# Patient Record
Sex: Male | Born: 1970
Health system: Southern US, Community
[De-identification: ages and names within clinical notes are randomized; demographics above are authoritative.]

## PROBLEM LIST (undated history)

## (undated) DIAGNOSIS — T7840XA Allergy, unspecified, initial encounter: Secondary | ICD-10-CM

## (undated) DIAGNOSIS — I10 Essential (primary) hypertension: Secondary | ICD-10-CM

## (undated) HISTORY — PX: APPENDECTOMY: SHX54

## (undated) HISTORY — PX: ANKLE ARTHROSCOPY: SUR85

## (undated) HISTORY — PX: SHOULDER ARTHROSCOPY: SHX128

## (undated) HISTORY — PX: NASAL SEPTUM SURGERY: SHX37

## (undated) HISTORY — PX: KNEE ARTHROSCOPY: SHX127

## (undated) HISTORY — DX: Essential (primary) hypertension: I10

## (undated) HISTORY — DX: Allergy, unspecified, initial encounter: T78.40XA

---

## 1998-03-18 ENCOUNTER — Emergency Department (HOSPITAL_COMMUNITY): Admission: EM | Admit: 1998-03-18 | Discharge: 1998-03-18 | Payer: Self-pay | Admitting: Emergency Medicine

## 2005-02-16 ENCOUNTER — Emergency Department (HOSPITAL_COMMUNITY): Admission: EM | Admit: 2005-02-16 | Discharge: 2005-02-16 | Payer: Self-pay | Admitting: Emergency Medicine

## 2006-12-04 IMAGING — CR DG WRIST COMPLETE 3+V*R*
4 series · 4 of 4 positions shown · non-contrast
Comparison: none

CLINICAL DATA: Laceration adjacent to the base of the first metacarpal following
an injury.

RIGHT WRIST - 4 VIEW

[view not recorded (1 of 4)]
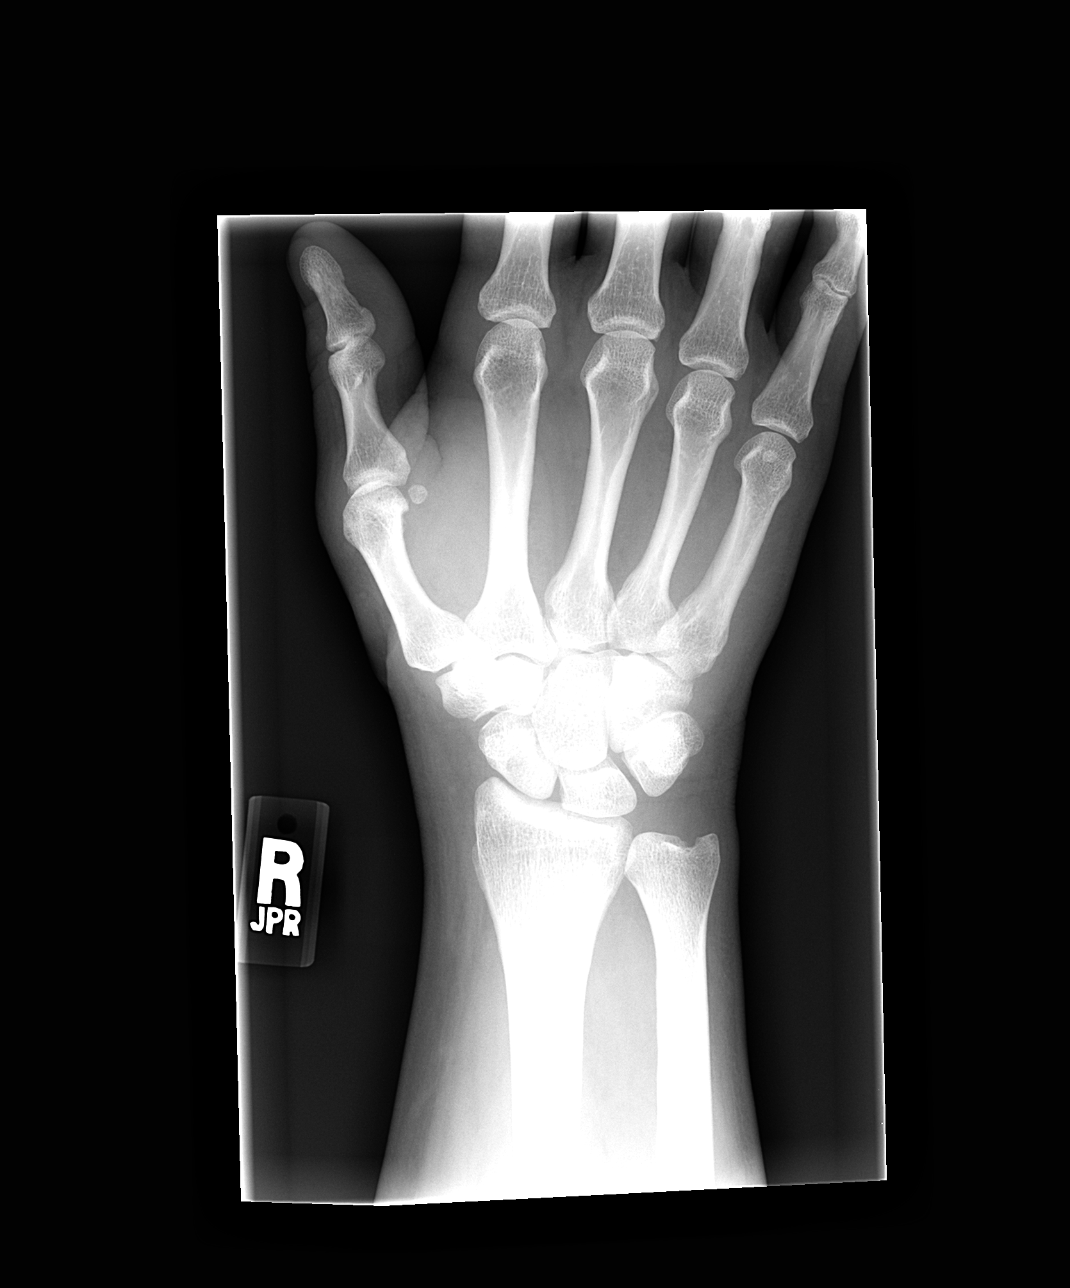

[view not recorded (2 of 4)]
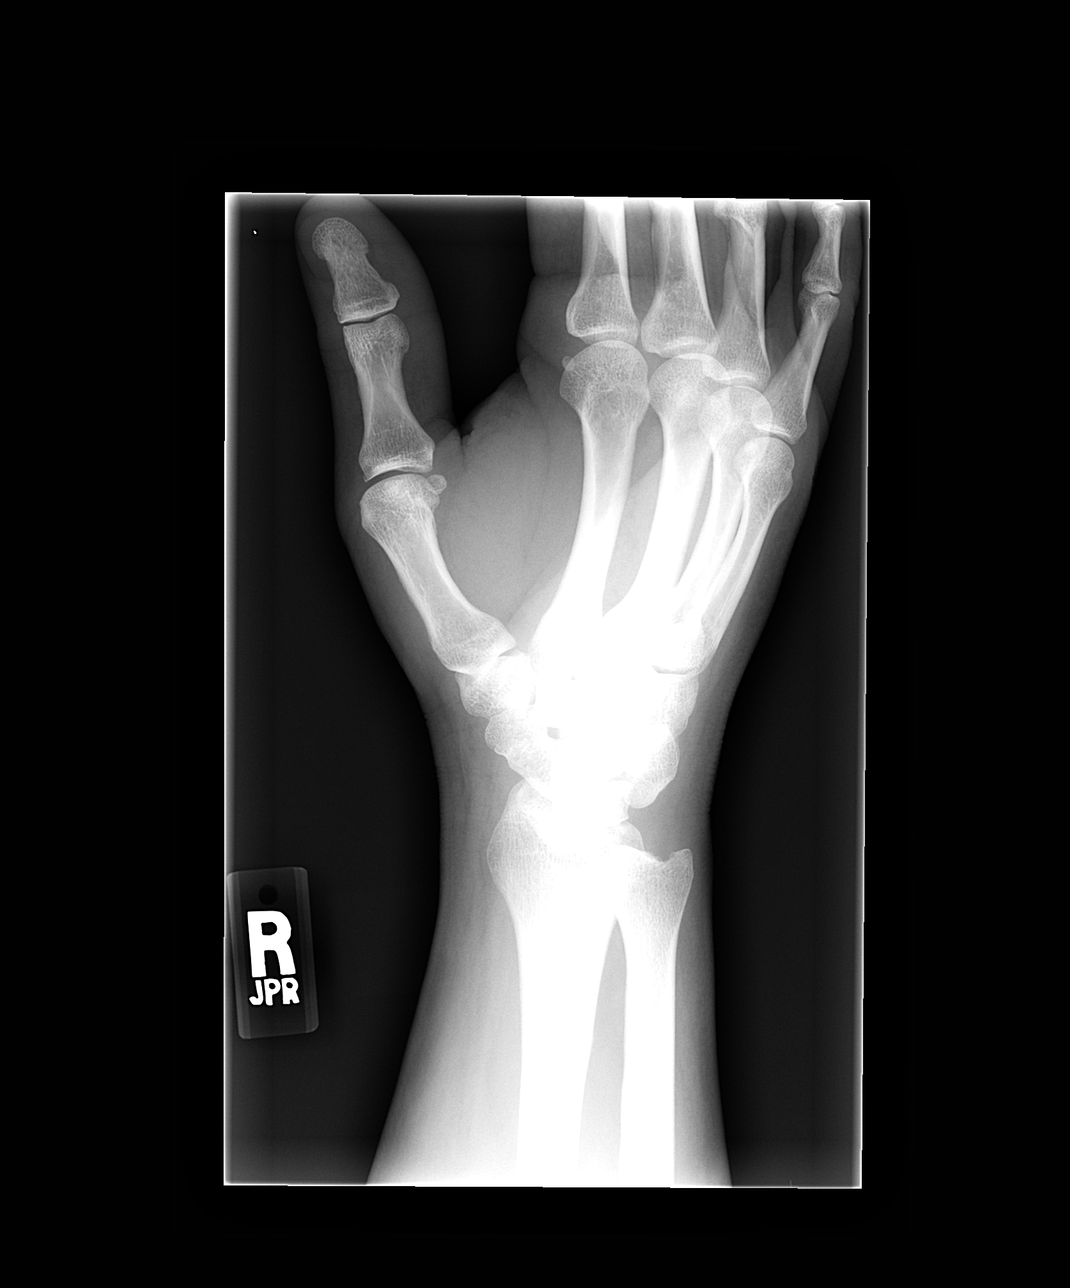

[view not recorded (3 of 4)]
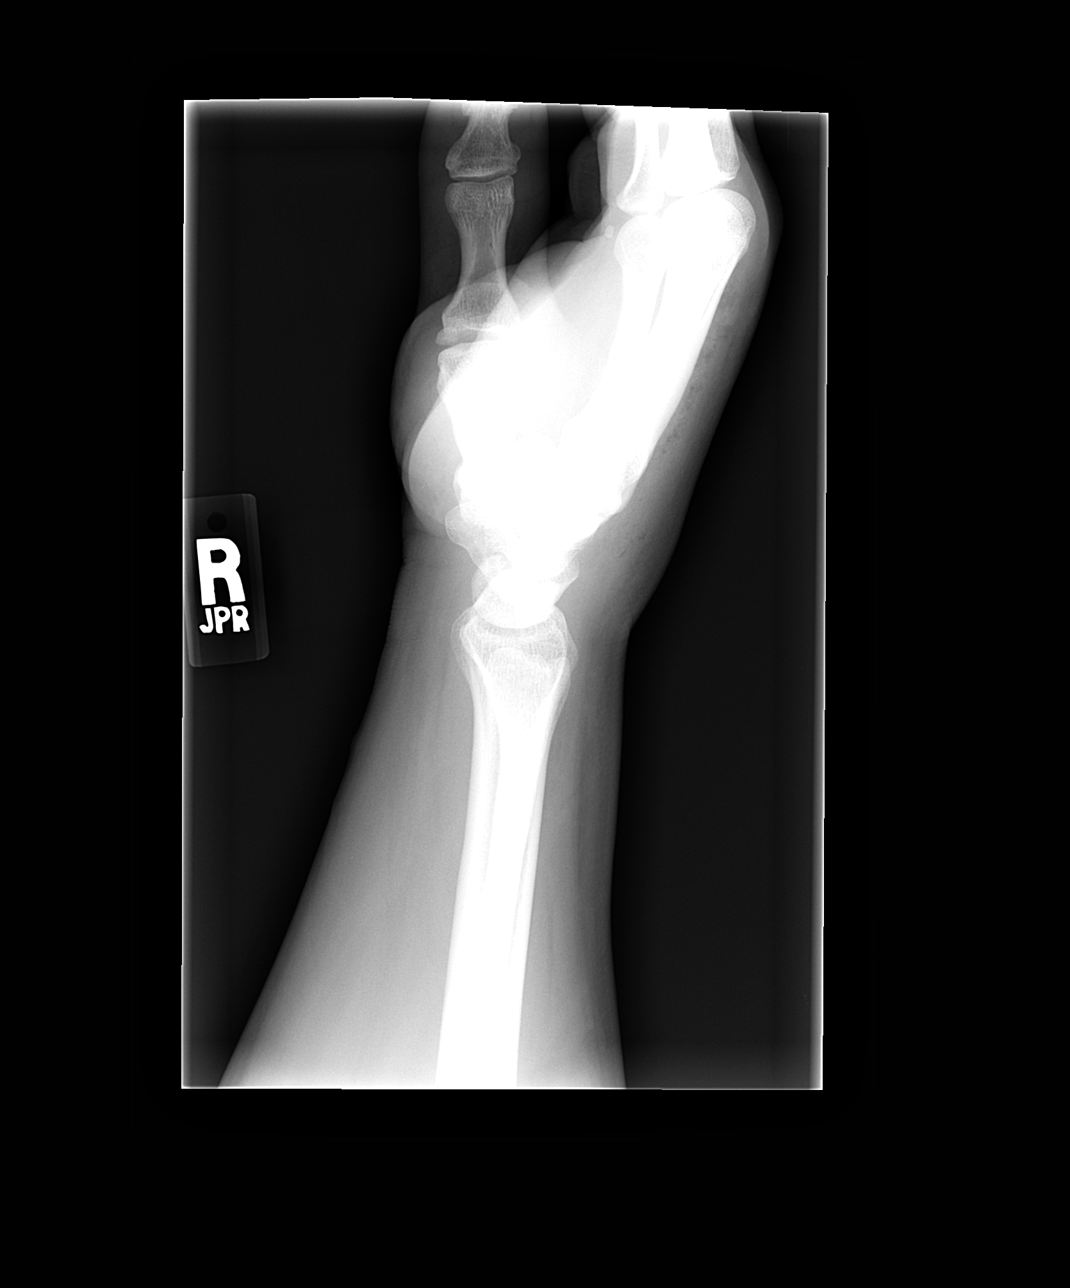

[view not recorded (4 of 4)]
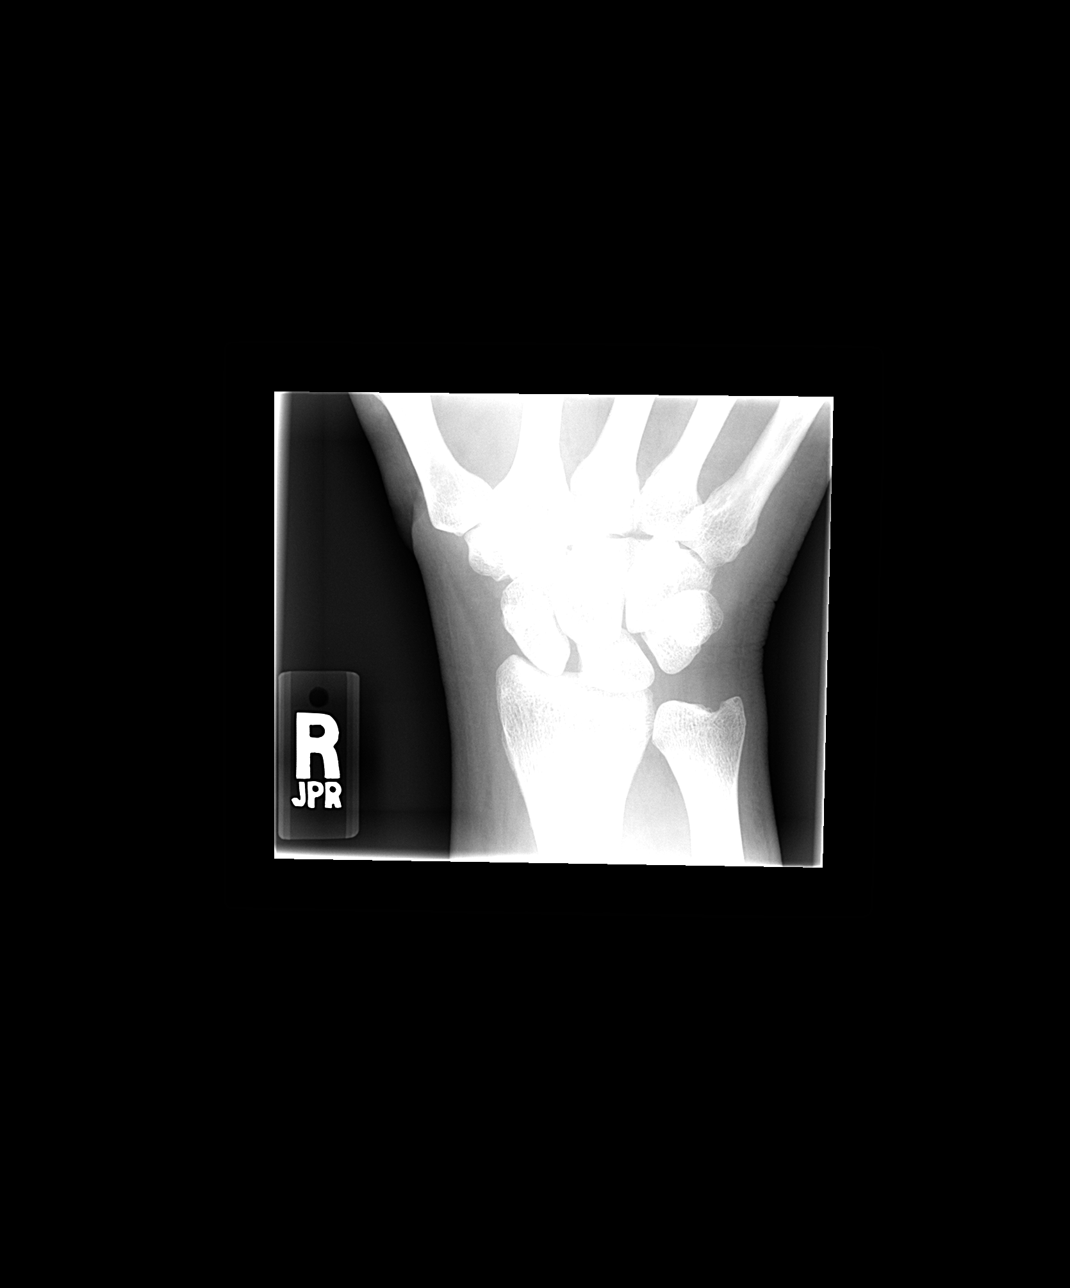

[4 of 4 positions shown; findings below may reference images not displayed]

FINDINGS: Soft tissue laceration lateral to the proximal shaft of the first
metacarpal. No fracture, dislocation or radiopaque foreign body seen.

IMPRESSION

No fracture or radiopaque foreign body.

## 2017-05-25 ENCOUNTER — Ambulatory Visit (INDEPENDENT_AMBULATORY_CARE_PROVIDER_SITE_OTHER): Payer: Self-pay | Admitting: Family Medicine

## 2017-05-25 ENCOUNTER — Encounter: Payer: Self-pay | Admitting: Family Medicine

## 2017-05-25 VITALS — BP 125/85 | HR 53 | Ht 72.0 in | Wt 267.0 lb

## 2017-05-25 DIAGNOSIS — J302 Other seasonal allergic rhinitis: Secondary | ICD-10-CM

## 2017-05-25 DIAGNOSIS — I10 Essential (primary) hypertension: Secondary | ICD-10-CM

## 2017-05-25 DIAGNOSIS — Z789 Other specified health status: Secondary | ICD-10-CM

## 2017-05-25 DIAGNOSIS — Z9049 Acquired absence of other specified parts of digestive tract: Secondary | ICD-10-CM

## 2017-05-25 DIAGNOSIS — E669 Obesity, unspecified: Secondary | ICD-10-CM

## 2017-05-25 DIAGNOSIS — K219 Gastro-esophageal reflux disease without esophagitis: Secondary | ICD-10-CM

## 2017-05-25 DIAGNOSIS — IMO0001 Reserved for inherently not codable concepts without codable children: Secondary | ICD-10-CM

## 2017-05-25 DIAGNOSIS — Z87891 Personal history of nicotine dependence: Secondary | ICD-10-CM

## 2017-05-25 MED ORDER — HYDROCHLOROTHIAZIDE 12.5 MG PO TABS: 12.5000 mg | ORAL_TABLET | Freq: Every day | ORAL | 0 refills | Status: DC

## 2017-05-25 NOTE — Patient Instructions (Addendum)
Please before you leave today schedule fasting blood for work for tomorrow or sometime this week or next    Please realize, EXERCISE IS MEDICINE!  -  American Heart Association [facility]) guidelines for exercise : If you are in good health, without any medical conditions, you should engage in 150 minutes of moderate intensity aerobic activity per week.  This means you should be huffing and puffing throughout your workout.   Engaging in regular exercise will improve brain function and memory, as well as improve mood, boost immune system and help with weight management.  As well as the other, more well-known effects of exercise such as decreasing blood sugar levels, decreasing blood pressure,  and decreasing bad cholesterol levels/ increasing good cholesterol levels.     -  The AHA strongly endorses consumption of a diet that contains a variety of foods from all the food categories with an emphasis on fruits and vegetables; fat-free and low-fat dairy products; cereal and grain products; legumes and nuts; and fish, poultry, and/or extra lean meats.    Excessive food intake, especially of foods high in saturated and trans fats, sugar, and salt, should be avoided.    Adequate water intake of roughly 1/2 of your weight in pounds, should equal the ounces of water per day you should drink.  So for instance, if you're 200 pounds, that would be 100 ounces of water per day.          Check out the DASH diet = 1.5 Gram Low Sodium Diet   A 1.5 gram sodium diet restricts the amount of sodium in the diet to no more than 1.5 g or 1500 mg daily.  The American Heart Association recommends Americans over the age of 36 to consume no more than 1500 mg of sodium each day to reduce the risk of developing high blood pressure.  Research also shows that limiting sodium may reduce heart attack and stroke risk.  Many foods contain sodium for flavor and sometimes as a preservative.  When the amount of sodium in a diet needs to be  low, it is important to know what to look for when choosing foods and drinks.  The following includes some information and guidelines to help make it easier for you to adapt to a low sodium diet.    QUICK TIPS  Do not add salt to food.  Avoid convenience items and fast food.  Choose unsalted snack foods.  Buy lower sodium products, often labeled as "lower sodium" or "no salt added."  Check food labels to learn how much sodium is in 1 serving.  When eating at a restaurant, ask that your food be prepared with less salt or none, if possible.    READING FOOD LABELS FOR SODIUM INFORMATION  The nutrition facts label is a good place to find how much sodium is in foods. Look for products with no more than 400 mg of sodium per serving.  Remember that 1.5 g = 1500 mg.  The food label may also list foods as:  Sodium-free: Less than 5 mg in a serving.  Very low sodium: 35 mg or less in a serving.  Low-sodium: 140 mg or less in a serving.  Light in sodium: 50% less sodium in a serving. For example, if a food that usually has 300 mg of sodium is changed to become light in sodium, it will have 150 mg of sodium.  Reduced sodium: 25% less sodium in a serving. For example, if a food  that usually has 400 mg of sodium is changed to reduced sodium, it will have 300 mg of sodium.    CHOOSING FOODS  Grains  Avoid: Salted crackers and snack items. Some cereals, including instant hot cereals. Bread stuffing and biscuit mixes. Seasoned rice or pasta mixes.  Choose: Unsalted snack items. Low-sodium cereals, oats, puffed wheat and rice, shredded wheat. English muffins and bread. Pasta.  Meats  Avoid: Salted, canned, smoked, spiced, pickled meats, including fish and poultry. Bacon, ham, sausage, cold cuts, hot dogs, anchovies.  Choose: Low-sodium canned tuna and salmon. Fresh or frozen meat, poultry, and fish.  Dairy  Avoid: Processed cheese and spreads. Cottage cheese. Buttermilk and condensed milk. Regular  cheese.  Choose: Milk. Low-sodium cottage cheese. Yogurt. Sour cream. Low-sodium cheese.  Fruits and Vegetables  Avoid: Regular canned vegetables. Regular canned tomato sauce and paste. Frozen vegetables in sauces. Olives. Rosita FirePickles. Relishes. Sauerkraut.  Choose: Low-sodium canned vegetables. Low-sodium tomato sauce and paste. Frozen or fresh vegetables. Fresh and frozen fruit.  Condiments  Avoid: Canned and packaged gravies. Worcestershire sauce. Tartar sauce. Barbecue sauce. Soy sauce. Steak sauce. Ketchup. Onion, garlic, and table salt. Meat flavorings and tenderizers.  Choose: Fresh and dried herbs and spices. Low-sodium varieties of mustard and ketchup. Lemon juice. Tabasco sauce. Horseradish.    SAMPLE 1.5 GRAM SODIUM MEAL PLAN:   Breakfast / Sodium (mg)  1 cup low-fat milk / 143 mg  1 whole-wheat English muffin / 240 mg  1 tbs heart-healthy margarine / 153 mg  1 hard-boiled egg / 139 mg  1 small orange / 0 mg  Lunch / Sodium (mg)  1 cup raw carrots / 76 mg  2 tbs no salt added peanut butter / 5 mg  2 slices whole-wheat bread / 270 mg  1 tbs jelly / 6 mg   cup red grapes / 2 mg  Dinner / Sodium (mg)  1 cup whole-wheat pasta / 2 mg  1 cup low-sodium tomato sauce / 73 mg  3 oz lean ground beef / 57 mg  1 small side salad (1 cup raw spinach leaves,  cup cucumber,  cup yellow bell pepper) with 1 tsp olive oil and 1 tsp red wine vinegar / 25 mg  Snack / Sodium (mg)  1 container low-fat vanilla yogurt / 107 mg  3 graham cracker squares / 127 mg  Nutrient Analysis  Calories: 1745  Protein: 75 g  Carbohydrate: 237 g  Fat: 57 g  Sodium: 1425 mg  Document Released: 10/18/2005 Document Revised: 06/30/2011 Document Reviewed: 01/19/2010  [facility] Patient Information 2012 [employer].      Mediterranean Diet  Why follow it? Research shows. . Those who follow the Mediterranean diet have a reduced risk of heart disease  . The diet is associated with a reduced incidence of  Parkinson's and Alzheimer's diseases . People following the diet may have longer life expectancies and lower rates of chronic diseases  . The Dietary Guidelines for Americans recommends the Mediterranean diet as an eating plan to promote health and prevent disease  What Is the Mediterranean Diet?  . Healthy eating plan based on typical foods and recipes of Mediterranean-style cooking . The diet is primarily a plant based diet; these foods should make up a majority of meals   Starches - Plant based foods should make up a majority of meals - They are an important sources of vitamins, minerals, energy, antioxidants, and fiber - Choose whole grains, foods high in fiber and minimally processed  items  - Typical grain sources include wheat, oats, barley, corn, brown rice, bulgar, farro, millet, polenta, couscous  - Various types of beans include chickpeas, lentils, fava beans, black beans, white beans   Fruits  Veggies - Large quantities of antioxidant rich fruits & veggies; 6 or more servings  - Vegetables can be eaten raw or lightly drizzled with oil and cooked  - Vegetables common to the traditional Mediterranean Diet include: artichokes, arugula, beets, broccoli, brussel sprouts, cabbage, carrots, celery, collard greens, cucumbers, eggplant, kale, leeks, lemons, lettuce, mushrooms, okra, onions, peas, peppers, potatoes, pumpkin, radishes, rutabaga, shallots, spinach, sweet potatoes, turnips, zucchini - Fruits common to the Mediterranean Diet include: apples, apricots, avocados, cherries, clementines, dates, figs, grapefruits, grapes, melons, nectarines, oranges, peaches, pears, pomegranates, strawberries, tangerines  Fats - Replace butter and margarine with healthy oils, such as olive oil, canola oil, and tahini  - Limit nuts to no more than a handful a day  - Nuts include walnuts, almonds, pecans, pistachios, pine nuts  - Limit or avoid candied, honey roasted or heavily salted nuts - Olives are  central to the [employer] - can be eaten whole or used in a variety of dishes   Meats Protein - Limiting red meat: no more than a few times a month - When eating red meat: choose lean cuts and keep the portion to the size of deck of cards - Eggs: approx. 0 to 4 times a week  - Fish and lean poultry: at least 2 a week  - Healthy protein sources include, chicken, [place], lean beef, lamb - Increase intake of seafood such as tuna, salmon, trout, mackerel, shrimp, scallops - Avoid or limit high fat processed meats such as sausage and bacon  Dairy - Include moderate amounts of low fat dairy products  - Focus on healthy dairy such as fat free yogurt, skim milk, low or reduced fat cheese - Limit dairy products higher in fat such as whole or 2% milk, cheese, ice cream  Alcohol - Moderate amounts of red wine is ok  - No more than 5 oz daily for women (all ages) and men older than age 66  - No more than 10 oz of wine daily for men younger than 97  Other - Limit sweets and other desserts  - Use herbs and spices instead of salt to flavor foods  - Herbs and spices common to the traditional Mediterranean Diet include: basil, bay leaves, chives, cloves, cumin, fennel, garlic, lavender, marjoram, mint, oregano, parsley, pepper, rosemary, sage, savory, sumac, tarragon, thyme   It's not just a diet, it's a lifestyle:  . The Mediterranean diet includes lifestyle factors typical of those in the region  . Foods, drinks and meals are best eaten with others and savored . Daily physical activity is important for overall good health . This could be strenuous exercise like running and aerobics . This could also be more leisurely activities such as walking, housework, yard-work, or taking the stairs . Moderation is the key; a balanced and healthy diet accommodates most foods and drinks . Consider portion sizes and frequency of consumption of certain foods   Meal Ideas & Options:  . Breakfast:  o Whole wheat  toast or whole wheat English muffins with peanut butter & hard boiled egg o Steel cut oats topped with apples & cinnamon and skim milk  o Fresh fruit: banana, strawberries, melon, berries, peaches  o Smoothies: strawberries, bananas, greek yogurt, peanut butter o Low fat greek yogurt with  blueberries and granola  o Egg white omelet with spinach and mushrooms o Breakfast couscous: whole wheat couscous, apricots, skim milk, cranberries  . Sandwiches:  o Hummus and grilled vegetables (peppers, zucchini, squash) on whole wheat bread   o Grilled chicken on whole wheat pita with lettuce, tomatoes, cucumbers or tzatziki  o Tuna salad on whole wheat bread: tuna salad made with greek yogurt, olives, red peppers, capers, green onions o Garlic rosemary lamb pita: lamb sauted with garlic, rosemary, salt & pepper; add lettuce, cucumber, greek yogurt to pita - flavor with lemon juice and black pepper  . Seafood:  o Mediterranean grilled salmon, seasoned with garlic, basil, parsley, lemon juice and black pepper o Shrimp, lemon, and spinach whole-grain pasta salad made with low fat greek yogurt  o Seared scallops with lemon orzo  o Seared tuna steaks seasoned salt, pepper, coriander topped with tomato mixture of olives, tomatoes, olive oil, minced garlic, parsley, green onions and cappers  . Meats:  o Herbed greek chicken salad with kalamata olives, cucumber, feta  o Red bell peppers stuffed with spinach, bulgur, lean ground beef (or lentils) & topped with feta   o Kebabs: skewers of chicken, tomatoes, onions, zucchini, squash  o Mal[place]urkey burgers: made with red onions, mint, dill, lemon juice, feta cheese topped with roasted red peppers . Vegetarian o Cucumber salad: cucumbers, artichoke hearts, celery, red onion, feta cheese, tossed in olive oil & lemon juice  o Hummus and whole grain pita points with a greek salad (lettuce, tomato, feta, olives, cucumbers, red onion) o Lentil soup with celery, carrots made  with vegetable broth, garlic, salt and pepper  o Tabouli salad: parsley, bulgur, mint, scallions, cucumbers, tomato, radishes, lemon juice, olive oil, salt and pepper.

## 2017-05-25 NOTE — Progress Notes (Signed)
New patient office visit note:  Impression and Recommendations:    1. Essential hypertension   2. Seasonal allergic rhinitis, unspecified trigger   3. History of smoking for 2-5 years ( 5-10 cig d for about 7 yrs or so.)   3 pk yr hx   4. Social alcohol use   5. Obesity, Class II, BMI 35-39.9, with comorbidity   6. S/P appendectomy   7. Gastroesophageal reflux disease, esophagitis presence not specified      No problem-specific Assessment & Plan notes found for this encounter.   The patient was counseled, risk factors were discussed, anticipatory guidance given.   New Prescriptions   No medications on file     Discontinued Medications   No medications on file      Orders Placed This Encounter  Procedures  . CBC with Differential/Platelet  . Comprehensive metabolic panel  . Hemoglobin A1c  . Hepatitis C antibody  . HIV antibody  . Lipid panel  . T4, free  . TSH  . VITAMIN D 25 Hydroxy (Vit-D Deficiency, Fractures)     Gross side effects, risk and benefits, and alternatives of medications discussed with patient.  Patient is aware that all medications have potential side effects and we are unable to predict every side effect or drug-drug interaction that may occur.  Expresses verbal understanding and consents to current therapy plan and treatment regimen.  Return in about 4 months (around 09/25/2017) for compete physical, come fasting [facility].  Please see AVS handed out to patient at the end of our visit for further patient instructions/ counseling done pertaining to today's office visit.    Note: This document was prepared using Dragon voice recognition software and may include unintentional dictation errors.  ----------------------------------------------------------------------------------------------------------------------    Subjective:    Chief complaint:   Chief Complaint  Patient presents with  . Establish Care    HPI: Bradley Burns is a pleasant 46 y.o. male who [facility] to review their medical history with me and establish care.   I asked the patient to review their chronic problem list with me to ensure everything was updated and accurate.    All recent office visits with other providers, any medical records that patient brought in etc  - I reviewed today.     Also asked pt to get me medical records from [facility] providers/ specialists that they had seen within the past 3-5 years- if they are in private practice and/or do not work for a [facility], [facility], [facility], [facility] or [facility] owned practice.  Told them to call their specialists to clarify this if they are not sure.   -->  Driving truck for 3 yrs now.  Long hauls- sometimes away for 2-3 wks at a time.   2 kids.    [place]- [place] yrs. No hobbies.   Works 70+ hrs per week.    Eats a lot of soups, canned foods ( a lot of sodium). Nabs, [place] sausages, spam.  [place]     Problem  Hypertension   Dr Peterson AoJared Hill was past PCP-   - dx HTN 3-4 yrs ago.   Seasonal Allergies   H/o shots for allergies  occ uses mucinex when gets bad   History of Smoking for 2-5 Years  Social Alcohol Use  Obesity, Class II, Bmi 35-39.9, With Comorbidity  S/P Appendectomy  Gerd (Gastroesophageal Reflux Disease)  Diet controlled. avoids soda etc       Wt Readings from Last 3 Encounters:  05/25/17 267 lb (121.1 kg)   BP Readings from Last 3 Encounters:  05/25/17 125/85   Pulse Readings from Last 3 Encounters:  05/25/17 (!) 53   BMI Readings from Last 3 Encounters:  05/25/17 36.21 kg/m    Patient Care Team    Relationship Specialty Notifications Start End  Bradley Burns, Bradley Odwyer, DO PCP - General Family Medicine  05/25/17   Keturah Barrerossley, James J, MD Consulting Physician Otolaryngology  05/25/17     Patient Active Problem List   Diagnosis Date Noted  . Hypertension 05/25/2017    Priority: High  .  Seasonal allergies 05/25/2017  . History of smoking for 2-5 years 05/25/2017  . Social alcohol use 05/25/2017  . Obesity, Class II, BMI 35-39.9, with comorbidity 05/25/2017  . S/P appendectomy 05/25/2017  . GERD (gastroesophageal reflux disease) 05/25/2017     Past Medical History:  Diagnosis Date  . Allergy   . Hypertension      Past Medical History:  Diagnosis Date  . Allergy   . Hypertension      Past Surgical History:  Procedure Laterality Date  . ANKLE ARTHROSCOPY Bilateral   . APPENDECTOMY    . NASAL SEPTUM SURGERY    . SHOULDER ARTHROSCOPY Left      Family History  Problem Relation Age of Onset  . Hypertension Father      History  Drug Use No     History  Alcohol Use  . 2.4 oz/week  . 4 Standard drinks or equivalent per week     History  Smoking Status  . Never Smoker  Smokeless Tobacco  . Never Used     Outpatient Encounter Prescriptions as of 05/25/2017  Medication Sig  . fluticasone (FLONASE) 50 MCG/ACT nasal spray Place 2 sprays into both nostrils as needed for allergies or rhinitis.  . hydrochlorothiazide (HYDRODIURIL) 12.5 MG tablet Take 1 tablet (12.5 mg total) by mouth daily.  . [DISCONTINUED] hydrochlorothiazide (HYDRODIURIL) 12.5 MG tablet Take 1 tablet by mouth daily.  . [DISCONTINUED] hydrochlorothiazide (HYDRODIURIL) 12.5 MG tablet Take 1 tablet (12.5 mg total) by mouth daily.   No facility-administered encounter medications on file as of 05/25/2017.     Allergies: Patient has no allergy information on record.   ROS   Objective:   Blood pressure 125/85, pulse (!) 53, height 6' (1.829 m), weight 267 lb (121.1 kg). Body mass index is 36.21 kg/m. General: Well Developed, well nourished, and in no acute distress.  Neuro: Alert and oriented x3, extra-ocular muscles intact, sensation grossly intact.  HEENT:/AT, PERRLA, neck supple, No carotid bruits Skin: no gross rashes  Cardiac: Regular rate and rhythm Respiratory:  Essentially clear to auscultation bilaterally. Not using accessory muscles, speaking in full sentences.  Abdominal: not grossly distended Musculoskeletal: Ambulates w/o diff, FROM * 4 ext.  Vasc: less 2 sec cap RF, warm and pink  Psych:  No HI/SI, judgement and insight good, Euthymic mood. Full Affect.    No results found for this or any previous visit (from the past 2160 hour(s)).

## 2017-06-01 ENCOUNTER — Ambulatory Visit (INDEPENDENT_AMBULATORY_CARE_PROVIDER_SITE_OTHER): Payer: BLUE CROSS/BLUE SHIELD | Admitting: Family Medicine

## 2017-06-01 ENCOUNTER — Encounter: Payer: Self-pay | Admitting: Family Medicine

## 2017-06-01 VITALS — BP 127/86 | HR 62 | Ht 72.0 in | Wt 237.3 lb

## 2017-06-01 DIAGNOSIS — Z23 Encounter for immunization: Secondary | ICD-10-CM

## 2017-06-01 DIAGNOSIS — Z719 Counseling, unspecified: Secondary | ICD-10-CM

## 2017-06-01 DIAGNOSIS — K219 Gastro-esophageal reflux disease without esophagitis: Secondary | ICD-10-CM

## 2017-06-01 DIAGNOSIS — E669 Obesity, unspecified: Secondary | ICD-10-CM

## 2017-06-01 DIAGNOSIS — I10 Essential (primary) hypertension: Secondary | ICD-10-CM

## 2017-06-01 DIAGNOSIS — Z789 Other specified health status: Secondary | ICD-10-CM

## 2017-06-01 DIAGNOSIS — IMO0001 Reserved for inherently not codable concepts without codable children: Secondary | ICD-10-CM

## 2017-06-01 DIAGNOSIS — Z0000 Encounter for general adult medical examination without abnormal findings: Secondary | ICD-10-CM

## 2017-06-01 DIAGNOSIS — Z87891 Personal history of nicotine dependence: Secondary | ICD-10-CM

## 2017-06-01 DIAGNOSIS — J302 Other seasonal allergic rhinitis: Secondary | ICD-10-CM

## 2017-06-01 DIAGNOSIS — Z1389 Encounter for screening for other disorder: Secondary | ICD-10-CM

## 2017-06-01 NOTE — Progress Notes (Signed)
Male physical  Impression and Recommendations:    1. Immunization due   2. Encounter for wellness examination   3. Screening for multiple conditions   4. Health education/counseling   5. Essential hypertension   6. Obesity, Class II, BMI 35-39.9, with comorbidity   7. History of smoking for 2-5 years   8. Social alcohol use     Orders Placed This Encounter  Procedures  . Tdap vaccine greater than or equal to 47yo IM  . CBC with Differential/Platelet  . Comprehensive metabolic panel    Order Specific Question:   Has the patient fasted?    Answer:   Yes  . Hemoglobin A1c  . Hepatitis C antibody  . HIV antibody  . Lipid panel    Order Specific Question:   Has the patient fasted?    Answer:   Yes  . T4, free  . TSH  . VITAMIN D 25 Hydroxy (Vit-D Deficiency, Fractures)    Patient's Medications  New Prescriptions   No medications on file  Previous Medications   FLUTICASONE (FLONASE) 50 MCG/ACT NASAL SPRAY    Place 2 sprays into both nostrils as needed for allergies or rhinitis.   HYDROCHLOROTHIAZIDE (HYDRODIURIL) 12.5 MG TABLET    Take 1 tablet (12.5 mg total) by mouth daily.  Modified Medications   No medications on file  Discontinued Medications   No medications on file   Please see AVS handed out to patient at the end of our visit for further patient instructions/ counseling done pertaining to today's office visit.  1) Anticipatory Guidance: Discussed importance of wearing a seatbelt while driving, not texting while driving;   sunscreen when outside along with skin surveillance; eating a balanced and modest diet; physical activity at least 25 minutes per day or 150 min/ week moderate to intense activity.  2) Immunizations / Screenings / Labs:  All immunizations are up-to-date per recommendations or will be updated today. Patient is due for dental and vision screens which pt will schedule independently. Will obtain CBC, CMP, HgA1c, Lipid panel, TSH and vit D when  fasting, if not already done recently.   3) Weight:  BMI meaning discussed with patient.  Discussed goal of losing 5-10% of current body weight which would improve overall feelings of well being and improve objective health data. Improve nutrient density of diet through increasing intake of fruits and vegetables and decreasing saturated fats, white flour products and refined sugars.    Gross side effects, risk and benefits, and alternatives of medications discussed with patient.  Patient is aware that all medications have potential side effects and we are unable to predict every side effect or drug-drug interaction that may occur.  Expresses verbal understanding and consents to current therapy plan and treatment regimen.  Follow-up preventative CPE in 1 year. Follow-up office visit pending lab work.  F/up sooner for chronic care management and/or prn    Subjective:    CC: CPE  HPI: Bradley Burns is a 46 y.o. male [facility] health maintenance exam.     Health Maintenance Summary Reviewed and updated, unless pt declines services.  Aspirin: administering 81 mg daily Colonoscopy:    n/a Tdap:   will update today Tobacco History Reviewed:   Yes-  CT scan for screening lung CA:   2014-- done for check and work-up with allergist- was N Alcohol:    No concerns but does drink more  than rec 3-4 d/wk.   Exercise Habits:   Not meeting aha guidelines STD concerns:   None  Drug Use:   None Birth control method:    vasectomy Testicular/penile concerns:     none   Health Maintenance  Topic Date Due  . INFLUENZA VACCINE  06/01/2017  . TETANUS/TDAP  10/31/2017 (Originally [birth year]/1989)  . HIV Screening  05/25/2029 (Originally [birth year]/16[birth year]1985)      Wt Readings from Last 3 Encounters:  06/01/17 237 lb 4.8 oz (107.6 kg)  05/25/17 267 lb (121.1 kg)   BP Readings from Last 3 Encounters:  06/01/17 127/86  05/25/17 125/85   Pulse  Readings from Last 3 Encounters:  06/01/17 62  05/25/17 (!) 53    Patient Active Problem List   Diagnosis Date Noted  . Hypertension 05/25/2017    Priority: High  . Seasonal allergies 05/25/2017  . History of smoking for 2-5 years 05/25/2017  . Social alcohol use 05/25/2017  . Obesity, Class II, BMI 35-39.9, with comorbidity 05/25/2017  . S/P appendectomy 05/25/2017  . GERD (gastroesophageal reflux disease) 05/25/2017    Past Medical History:  Diagnosis Date  . Allergy   . Hypertension     Past Surgical History:  Procedure Laterality Date  . ANKLE ARTHROSCOPY Bilateral   . APPENDECTOMY    . NASAL SEPTUM SURGERY    . SHOULDER ARTHROSCOPY Left     Family History  Problem Relation Age of Onset  . Hypertension Father     History  Drug Use No  ,  History  Alcohol Use  . 2.4 oz/week  . 4 Standard drinks or equivalent per week  ,  History  Smoking Status  . Never Smoker  Smokeless Tobacco  . Never Used  ,  History  Sexual Activity  . Sexual activity: Not Currently    Patient's Medications  New Prescriptions   No medications on file  Previous Medications   FLUTICASONE (FLONASE) 50 MCG/ACT NASAL SPRAY    Place 2 sprays into both nostrils as needed for allergies or rhinitis.   HYDROCHLOROTHIAZIDE (HYDRODIURIL) 12.5 MG TABLET    Take 1 tablet (12.5 mg total) by mouth daily.  Modified Medications   No medications on file  Discontinued Medications   No medications on file    Patient has no allergy information on record.  Review of Systems: General:   Denies fever, chills, unexplained weight loss.  Optho/Auditory:   Denies visual changes, blurred vision/LOV Respiratory:   Denies SOB, DOE more than baseline levels.  Cardiovascular:   Denies chest pain, palpitations, new onset peripheral edema  Gastrointestinal:   Denies nausea, vomiting, diarrhea.  Genitourinary: Denies dysuria, freq/ urgency, flank pain or discharge from genitals.  Endocrine:     Denies  hot or cold intolerance, polyuria, polydipsia. Musculoskeletal:   Denies unexplained myalgias, joint swelling, unexplained arthralgias, gait problems.  Skin:  Denies rash, suspicious lesions Neurological:     Denies dizziness, unexplained weakness, numbness  Psychiatric/Behavioral:   Denies mood changes, suicidal or homicidal ideations, hallucinations    Objective:     Blood pressure 127/86, pulse 62, height 6' (1.829 m), weight 237 lb 4.8 oz (107.6 kg). Body mass index is 32.18 kg/m. General Appearance:    Alert, cooperative, no distress, appears stated age  Head:    Normocephalic, without obvious abnormality, atraumatic  Eyes:    PERRL, conjunctiva/corneas clear, EOM's intact, fundi    benign, both eyes  Ears:    Normal TM's and external ear canals, both ears  Nose:   Nares normal, septum midline, mucosa normal, no drainage    or sinus tenderness  Throat:   Lips w/o lesion, mucosa moist, and tongue normal; teeth and   gums normal  Neck:   Supple, symmetrical, trachea midline, no adenopathy;    thyroid:  no enlargement/tenderness/nodules; no carotid   bruit or JVD  Back:     Symmetric, no curvature, ROM normal, no CVA tenderness  Lungs:     Clear to auscultation bilaterally, respirations unlabored, no       Wh/ R/ R  Chest Wall:    No tenderness or gross deformity; normal excursion   Heart:    Regular rate and rhythm, S1 and S2 normal, no murmur, rub   or gallop  Abdomen:     Soft, non-tender, bowel sounds active all four quadrants, NO   G/R/R, no masses, no organomegaly  Genitalia:    Ext genitalia: without lesion, no penile rash or discharge, no hernias appreciated   Rectal:    Normal tone, prostate WNL's and equal b/l, no tenderness; guaiac negative stool  Extremities:   Extremities normal, atraumatic, no cyanosis or gross edema  Pulses:   2+ and symmetric all extremities  Skin:   Warm, dry, Skin color, texture, turgor normal, no obvious rashes or lesions  M-Sk:   Ambulates *  4 w/o difficulty, no gross deformities, tone WNL  Neurologic:   CNII-XII intact, normal strength, sensation and reflexes    Throughout Psych:  No HI/SI, judgement and insight good, Euthymic mood. Full Affect.

## 2017-06-01 NOTE — Patient Instructions (Signed)
Preventive Care for Adults  A healthy lifestyle and preventive care can promote health and wellness. Preventive health guidelines for men include the following key practices:  .   A routine yearly physical is a good way to check with your health care provider about your health and preventative screening. It is a chance to share any concerns and updates on your health and to receive a thorough exam.  .  Visit your dentist for a routine exam and preventative care every 6 months. Brush your teeth twice a day and floss once a day. Good oral hygiene prevents tooth decay and gum disease.  .  The frequency of eye exams is based on your age, health, family medical history, use of contact lenses, and other factors.  Follow your health care provider's recommendations for frequency of eye exams.  .  Eat a healthy diet.  Foods such as vegetables, fruits, whole grains, low-fat dairy products, and lean protein foods contain the nutrients you need without too many calories.  Decrease your intake of foods high in solid fats, added sugars, and salt.  Eat the right amount of calories for you.  Get information about a proper diet from your health care provider, if necessary.  .  Regular physical exercise is one of the most important things you can do for your health.  Most adults should get at least 150 minutes of moderate-intensity exercise (any activity that increases your heart rate and causes you to sweat) each week.  In addition, most adults need muscle-strengthening exercises on 2 or more days a week.  .  Maintain a healthy weight. The body mass index (BMI) is a screening tool to identify possible weight problems. It provides an estimate of body fat based on height and weight. Your health care provider can find your BMI and can help you achieve or maintain a healthy weight. For adults 20 years and older: A BMI below 18.5 is considered underweight. A BMI of 18.5 to 24.9 is normal. A BMI of 25 to 29.9 is  considered overweight. A BMI of 30 and above is considered obese.  .  Maintain normal blood lipids and cholesterol levels by exercising and minimizing your intake of saturated fat. Eat a balanced diet with plenty of fruit and vegetables. Blood tests for lipids and cholesterol should begin at age 20 and be repeated every 5 years. If your lipid or cholesterol levels are high, you are over 50, or you are at high risk for heart disease, you may need your cholesterol levels checked more frequently. Ongoing high lipid and cholesterol levels should be treated with medicines if diet and exercise are not working.  .  If you smoke, find out from your health care provider how to quit. If you do not use tobacco, do not start.  . If you choose to drink alcohol, do not have more than 2 drinks per day. One drink is considered to be 12 ounces (355 mL) of beer, 5 ounces (148 mL) of wine, or 1.5 ounces (44 mL) of liquor.  . Avoid use of street drugs. Do not share needles with anyone. Ask for help if you need support or instructions about stopping the use of drugs.  . High blood pressure causes heart disease and increases the risk of stroke. Your blood pressure should be checked at least every 1-2 years. Ongoing high blood pressure should be treated with medicines, if weight loss and exercise are not effective.  . If you are 45-79 years old,   ask your health care provider if you should take aspirin to prevent heart disease.  . Diabetes screening involves taking a blood sample to check your fasting blood sugar level.  This should be done once every 3 years, after age 45, if you are within normal weight and without risk factors for diabetes.  Testing should be considered at a younger age or be carried out more frequently if you are overweight and have at least 1 risk factor for diabetes.  . Colorectal cancer can be detected and often prevented. Most routine colorectal cancer screening begins at the age of 50 and  continues through age 75. However, your health care provider may recommend screening at an earlier age if you have risk factors for colon cancer. On a yearly basis, your health care provider may provide home test kits to check for hidden blood in the stool. Use of a small camera at the end of a tube to directly examine the colon (sigmoidoscopy or colonoscopy) can detect the earliest forms of colorectal cancer. Talk to your health care provider about this at age 50, when routine screening begins. Direct exam of the colon should be repeated every 5-10 years through age 75, unless early forms of precancerous polyps or small growths are found.  .  Lung cancer screening is recommended for adults aged 55-80 years who are at high risk for developing lung cancer because of a history of smoking. A yearly low-dose CT scan of the lungs is recommended for people who have at least a 30-pack-year history of smoking and are a current smoker or have quit within the past 15 years. A pack year of smoking is smoking an average of 1 pack of cigarettes a day for 1 year (for example: 1 pack a day for 30 years or 2 packs a day for 15 years). Yearly screening should continue until the smoker has stopped smoking for at least 15 years. Yearly screening should be stopped for people who develop a health problem that would prevent them from having lung cancer treatment.  . Talk with your health care provider about prostate cancer screening.  . Testicular cancer screening is recommended for adult males. Screening includes self-exam and a health care provider exam. Consult with your health care provider about any symptoms you have or any concerns you have about testicular cancer.  . Use sunscreen. Apply sunscreen liberally and repeatedly throughout the day. You should seek shade when your shadow is shorter than you. Protect yourself by wearing long sleeves, pants, a wide-brimmed hat, and sunglasses year round, whenever you are  outdoors.  . Once a month, do a whole-body skin exam, using a mirror to look at the skin on your back. Tell your health care provider about new moles, moles that have irregular borders, moles that are larger than a pencil eraser, or moles that have changed in shape or color.    ++++++++++++++++++++++++++++++++++++++++++++++++++++++++++++++++++  Stay current with required vaccines (immunizations).  ? Influenza vaccine. All adults should be immunized every year.  ? Tetanus, diphtheria, and acellular pertussis (Td, Tdap) vaccine. An adult who has not previously received Tdap or who does not know his vaccine status should receive 1 dose of Tdap. This initial dose should be followed by tetanus and diphtheria toxoids (Td) booster doses every 10 years. Adults with an unknown or incomplete history of completing a 3-dose immunization series with Td-containing vaccines should begin or complete a primary immunization series including a Tdap dose. Adults should receive a Td booster every   10 years.  ? Varicella vaccine. An adult without evidence of immunity to varicella should receive 2 doses or a second dose if he has previously received 1 dose.  ? Human papillomavirus (HPV) vaccine. Males aged 13-21 years who have not received the vaccine previously should receive the 3-dose series. Males aged 22-26 years may be immunized. Immunization is recommended through the age of 26 years for any male who has sex with males and did not get any or all doses earlier. Immunization is recommended for any person with an immunocompromised condition through the age of 26 years if he did not get any or all doses earlier. During the 3-dose series, the second dose should be obtained 4-8 weeks after the first dose. The third dose should be obtained 24 weeks after the first dose and 16 weeks after the second dose.  ? Zoster vaccine. One dose is recommended for adults aged 60 years or older unless certain conditions are  present.   ? PREVNAR - Pneumococcal 13-valent conjugate (PCV13) vaccine. When indicated, a person who is uncertain of his immunization history and has no record of immunization should receive the PCV13 vaccine. An adult aged 19 years or older who has certain medical conditions and has not been previously immunized should receive 1 dose of PCV13 vaccine. This PCV13 should be followed with a dose of pneumococcal polysaccharide (PPSV23) vaccine. The PPSV23 vaccine dose should be obtained at least 8 weeks after the dose of PCV13 vaccine. An adult aged 19 years or older who has certain medical conditions and previously received 1 or more doses of PPSV23 vaccine should receive 1 dose of PCV13. The PCV13 vaccine dose should be obtained 1 or more years after the last PPSV23 vaccine dose.   ? PNEUMOVAX - Pneumococcal polysaccharide (PPSV23) vaccine. When PCV13 is also indicated, PCV13 should be obtained first. All adults aged 65 years and older should be immunized. An adult younger than age 65 years who has certain medical conditions should be immunized. Any person who resides in a nursing home or long-term care facility should be immunized. An adult smoker should be immunized. People with an immunocompromised condition and certain other conditions should receive both PCV13 and PPSV23 vaccines. People with human immunodeficiency virus (HIV) infection should be immunized as soon as possible after diagnosis. Immunization during chemotherapy or radiation therapy should be avoided. Routine use of PPSV23 vaccine is not recommended for American Indians, [place] Natives, or people younger than 65 years unless there are medical conditions that require PPSV23 vaccine. When indicated, people who have unknown immunization and have no record of immunization should receive PPSV23 vaccine. One-time revaccination 5 years after the first dose of PPSV23 is recommended for people aged 19-64 years who have chronic kidney failure,  nephrotic syndrome, asplenia, or immunocompromised conditions. People who received 1-2 doses of PPSV23 before age 65 years should receive another dose of PPSV23 vaccine at age 65 years or later if at least 5 years have passed since the previous dose. Doses of PPSV23 are not needed for people immunized with PPSV23 at or after age 65 years.   ? Hepatitis A vaccine. Adults who wish to be protected from this disease, have certain high-risk conditions, work with hepatitis A-infected animals, work in hepatitis A research labs, or travel to or work in countries with a high rate of hepatitis A should be immunized. Adults who were previously unvaccinated and who anticipate close contact with an international adoptee during the first 60 days after arrival in the   [place] from a country with a high rate of hepatitis A should be immunized.  ? Hepatitis B vaccine. Adults should be immunized if they wish to be protected from this disease, have certain high-risk conditions, may be exposed to blood or other infectious body fluids, are household contacts or sex partners of hepatitis B positive people, are clients or workers in certain care facilities, or travel to or work in countries with a high rate of hepatitis B.     Preventive Service / Frequency  . Ages 40 to 64  Blood pressure check.  Lipid and cholesterol check  Lung cancer screening. / Every year if you are aged 55-80 years and have a 30-pack-year history of smoking and currently smoke or have quit within the past 15 years. Yearly screening is stopped once you have quit smoking for at least 15 years or develop a health problem that would prevent you from having lung cancer treatment.  Fecal occult blood test (FOBT) of stool. / Every year beginning at age 50 and continuing until age 75. You may not have to do this test if you get a colonoscopy every 10 years.  Flexible sigmoidoscopy** or colonoscopy.** / Every 5 years for a flexible  sigmoidoscopy or every 10 years for a colonoscopy beginning at age 50 and continuing until age 75. Screening for abdominal aortic aneurysm (AAA) by ultrasound is recommended for people who have history of high blood pressure or who are current or former smokers.  ++++++++++++++++++++++++++++++++++++++++++++++++++++++++++++++  Recommend Adult Low Dose Aspirin or coated Aspirin 81 mg daily To reduce risk of Colon Cancer 20 % Skin Cancer 26 %  Melanoma 46% and Pancreatic cancer 60%  +++++++++++++++++++++++++++++++++++++++++++++++++++++++++++++  Vitamin D goal is between 50-100. Please make sure that you are taking your Vitamin D as directed.  It is very important as a natural anti-inflammatory - helping with muscle and joint aches; as well as helping hair, skin, and nails; as well as reducing stroke, heart attack and cancer risk. It helps your bones and helps with mood. It also decreases numerous cancer risks so please take it as directed.  - Low Vit D is associated with a 200-300% higher risk for CANCER and 200-300% higher risk for HEART ATTACK & STROKE.  It is also associated with higher death rate at younger ages, autoimmune diseases like Rheumatoid arthritis, Lupus, Multiple Sclerosis; also many other serious conditions, like depression, Alzheimer's Dementia, infertility, muscle aches, fatigue, fibromyalgia - just to name a few.  +++++++++++++++++++++++++++++++++++++++++++++++++++++++++++  Recommend the book "The END of DIETING" by Dr Joel Fuhrman & the book "The END of DIABETES " by Dr Joel Fuhrman At [employer] - get book & Audio CD's   --->Being diabetic has a 300% increased risk for heart attack, stroke, cancer, and alzheimer- type vascular dementia. It is very important that you work harder with diet by avoiding all foods that are white. Avoid white rice (brown & wild rice is OK), white potatoes (sweet potatoes in moderation is OK), White bread or wheat bread or anything made out  of white flour like bagels, donuts, rolls, buns, biscuits, cakes, pastries, cookies, pizza crust, and pasta (made from white flour & egg whites) - vegetarian pasta or spinach or wheat pasta is OK. Multigrain breads like Arnold's or [employer], or multigrain sandwich thins or flatbreads. Diet, exercise and weight loss can reverse and cure diabetes in the early stages. Diet, exercise and weight loss is very important in the control and prevention of complications of diabetes which   affects every system in your body, ie. Brain - dementia/stroke, eyes - glaucoma/blindness, heart - heart attack/heart failure, kidneys - dialysis, stomach - gastric paralysis, intestines - malabsorption, nerves - severe painful neuritis, circulation - gangrene & loss of a leg(s), and finally cancer and Alzheimers.  I recommend avoid fried & greasy foods, sweets/candy, white rice (brown or wild rice or Quinoa is OK), white potatoes (sweet potatoes are OK) - anything made from white flour - bagels, doughnuts, rolls, buns, biscuits,white and wheat breads, pizza crust and traditional pasta made of white flour & egg white(vegetarian pasta or spinach or wheat pasta is OK). Multi-grain bread is OK - like multi-grain flat bread or sandwich thins. Avoid alcohol in excess.  Exercise is also important. Eat all the vegetables you want - avoid fatty meats, especially red meat and dairy - especially cheese. Cheese is the most concentrated form of trans-fats which is the worst thing to clog up our arteries. Veggie cheese is OK which can be found in the fresh produce section at [employer] or [employer] or [employer].  ++++++++++++++++++++++ DASH Eating Plan  DASH stands for "Dietary Approaches to Stop Hypertension."  The DASH eating plan is a healthy eating plan that has been shown to reduce high blood pressure (hypertension). Additional health benefits may include reducing the risk of type 2 diabetes mellitus, heart disease, and stroke.  The DASH eating plan may also help with weight loss.  WHAT DO I NEED TO KNOW ABOUT THE DASH EATING PLAN? For the DASH eating plan, you will follow these general guidelines: . Choose foods with a percent daily value for sodium of less than 5% (as listed on the food label). . Use salt-free seasonings or herbs instead of table salt or sea salt. . Check with your health care provider or pharmacist before using salt substitutes. . Eat lower-sodium products, often labeled as "lower sodium" or "no salt added." . Eat fresh foods. . Eat more vegetables, fruits, and low-fat dairy products. . Choose whole grains. Look for the word "whole" as the first word in the ingredient list. . Choose fish . Limit sweets, desserts, sugars, and sugary drinks. . Choose heart-healthy fats. . Eat veggie cheese . Eat more home-cooked food and less restaurant, buffet, and fast food. . Limit fried foods. . Cook foods using methods other than frying. . Limit canned vegetables. If you do use them, rinse them well to decrease the sodium. . When eating at a restaurant, ask that your food be prepared with less salt, or no salt if possible.   WHAT FOODS CAN I EAT? Read Dr Joel Fuhrman's books on The End of Dieting & The End of Diabetes  Grains Whole grain or whole wheat bread. Brown rice. Whole grain or whole wheat pasta. Quinoa, bulgur, and whole grain cereals. Low-sodium cereals. Corn or whole wheat flour tortillas. Whole grain cornbread. Whole grain crackers. Low-sodium crackers.  Vegetables Fresh or frozen vegetables (raw, steamed, roasted, or grilled). Low-sodium or reduced-sodium tomato and vegetable juices. Low-sodium or reduced-sodium tomato sauce and paste. Low-sodium or reduced-sodium canned vegetables.  Fruits All fresh, canned (in natural juice), or frozen fruits.  Protein Products All fish and seafood. Dried beans, peas, or lentils. Unsalted nuts and seeds. Unsalted canned  beans.  Dairy Low-fat dairy products, such as skim or 1% milk, 2% or reduced-fat cheeses, low-fat ricotta or cottage cheese, or plain low-fat yogurt. Low-sodium or reduced-sodium cheeses.  Fats and Oils Tub margarines without trans fats. Light or reduced-fat mayonnaise and salad   dressings (reduced sodium). Avocado. Safflower, olive, or canola oils. Natural peanut or almond butter.  Other Unsalted popcorn and pretzels. The items listed above may not be a complete list of recommended foods or beverages. Contact your dietitian for more options.  ++++++++++++++++++++++++++++++++++++++++++++++++++++++++++++++++  WHAT FOODS ARE NOT RECOMMENDED?  Grains/ White flour or wheat flour White bread. White pasta. White rice. Refined cornbread. Bagels and croissants. Crackers that contain trans fat. Vegetables Creamed or fried vegetables. Vegetables in a . Regular canned vegetables. Regular canned tomato sauce and paste. Regular tomato and vegetable juices. Fruits Dried fruits. Canned fruit in light or heavy syrup. Fruit juice. Meat and Other Protein Products Meat in general - RED meat & White meat. Fatty cuts of meat. Ribs, chicken wings, all processed meats as bacon, sausage, bologna, salami, fatback, hot dogs, bratwurst and packaged luncheon meats. Dairy Whole or 2% milk, cream, half-and-half, and cream cheese. Whole-fat or sweetened yogurt. Full-fat cheeses or blue cheese. Non-dairy creamers and whipped toppings. Processed cheese, cheese spreads, or cheese curds.  Condiments Onion and garlic salt, seasoned salt, table salt, and sea salt. Canned and packaged gravies. Worcestershire sauce. Tartar sauce. Barbecue sauce. Teriyaki sauce. Soy sauce, including reduced sodium. Steak sauce. Fish sauce. Oyster sauce. Cocktail sauce. Horseradish. Ketchup and mustard. Meat flavorings and tenderizers. Bouillon cubes. Hot sauce. Tabasco sauce. Marinades. Taco seasonings. Relishes. Fats and Oils Butter, stick  margarine, lard, shortening and bacon fat. Coconut, palm kernel, or palm oils. Regular salad dressings. Pickles and olives. Salted popcorn and pretzels. The items listed above may not be a complete list of foods and beverages to avoid. 

## 2017-06-02 LAB — COMPREHENSIVE METABOLIC PANEL
A/G RATIO: 1.8 (ref 1.2–2.2)
ALT: 30 IU/L (ref 0–44)
AST: 22 IU/L (ref 0–40)
Albumin: 4.9 g/dL (ref 3.5–5.5)
Alkaline Phosphatase: 65 IU/L (ref 39–117)
BUN/Creatinine Ratio: 14 (ref 9–20)
BUN: 12 mg/dL (ref 6–24)
Bilirubin Total: 0.7 mg/dL (ref 0.0–1.2)
CALCIUM: 10 mg/dL (ref 8.7–10.2)
CO2: 22 mmol/L (ref 20–29)
Chloride: 99 mmol/L (ref 96–106)
Creatinine, Ser: 0.84 mg/dL (ref 0.76–1.27)
GFR, EST AFRICAN AMERICAN: 121 mL/min/{1.73_m2} (ref >59)
GFR, EST NON AFRICAN AMERICAN: 105 mL/min/{1.73_m2} (ref >59)
GLOBULIN, TOTAL: 2.7 g/dL (ref 1.5–4.5)
Glucose: 91 mg/dL (ref 65–99)
POTASSIUM: 4 mmol/L (ref 3.5–5.2)
Sodium: 140 mmol/L (ref 134–144)
TOTAL PROTEIN: 7.6 g/dL (ref 6.0–8.5)

## 2017-06-02 LAB — LIPID PANEL
CHOL/HDL RATIO: 4.2 ratio (ref 0.0–5.0)
CHOLESTEROL TOTAL: 200 mg/dL — AB (ref 100–199)
HDL: 48 mg/dL (ref >39)
LDL Calculated: 141 mg/dL — ABNORMAL HIGH (ref 0–99)
TRIGLYCERIDES: 56 mg/dL (ref 0–149)
VLDL Cholesterol Cal: 11 mg/dL (ref 5–40)

## 2017-06-02 LAB — CBC WITH DIFFERENTIAL/PLATELET
BASOS: 0 %
Basophils Absolute: 0 10*3/uL (ref 0.0–0.2)
EOS (ABSOLUTE): 0.1 10*3/uL (ref 0.0–0.4)
EOS: 1 %
HEMATOCRIT: 48.2 % (ref 37.5–51.0)
Hemoglobin: 16.8 g/dL (ref 13.0–17.7)
IMMATURE GRANS (ABS): 0 10*3/uL (ref 0.0–0.1)
IMMATURE GRANULOCYTES: 0 %
LYMPHS: 29 %
Lymphocytes Absolute: 1.9 10*3/uL (ref 0.7–3.1)
MCH: 30.4 pg (ref 26.6–33.0)
MCHC: 34.9 g/dL (ref 31.5–35.7)
MCV: 87 fL (ref 79–97)
MONOCYTES: 11 %
Monocytes Absolute: 0.8 10*3/uL (ref 0.1–0.9)
NEUTROS PCT: 59 %
Neutrophils Absolute: 3.9 10*3/uL (ref 1.4–7.0)
PLATELETS: 278 10*3/uL (ref 150–379)
RBC: 5.53 x10E6/uL (ref 4.14–5.80)
RDW: 13 % (ref 12.3–15.4)
WBC: 6.7 10*3/uL (ref 3.4–10.8)

## 2017-06-02 LAB — T4, FREE: Free T4: 1.32 ng/dL (ref 0.82–1.77)

## 2017-06-02 LAB — VITAMIN D 25 HYDROXY (VIT D DEFICIENCY, FRACTURES): Vit D, 25-Hydroxy: 18.9 ng/mL — ABNORMAL LOW (ref 30.0–100.0)

## 2017-06-02 LAB — HIV ANTIBODY (ROUTINE TESTING W REFLEX): HIV Screen 4th Generation wRfx: NONREACTIVE

## 2017-06-02 LAB — HEPATITIS C ANTIBODY

## 2017-06-02 LAB — HEMOGLOBIN A1C
Est. average glucose Bld gHb Est-mCnc: 105 mg/dL
Hgb A1c MFr Bld: 5.3 % (ref 4.8–5.6)

## 2017-06-02 LAB — TSH: TSH: 1.84 u[IU]/mL (ref 0.450–4.500)

## 2017-06-03 ENCOUNTER — Other Ambulatory Visit: Payer: Self-pay | Admitting: Family Medicine

## 2017-06-03 DIAGNOSIS — E559 Vitamin D deficiency, unspecified: Secondary | ICD-10-CM

## 2017-06-03 MED ORDER — VITAMIN D (ERGOCALCIFEROL) 1.25 MG (50000 UNIT) PO CAPS: 50000.0000 [IU] | ORAL_CAPSULE | ORAL | 10 refills | Status: DC

## 2017-06-03 MED ORDER — VITAMIN D3 125 MCG (5000 UT) PO TABS: ORAL_TABLET | ORAL | 3 refills | Status: DC

## 2017-06-09 ENCOUNTER — Encounter: Payer: Self-pay | Admitting: Family Medicine

## 2017-10-27 DIAGNOSIS — M25561 Pain in right knee: Secondary | ICD-10-CM | POA: Diagnosis not present

## 2017-10-27 DIAGNOSIS — M1711 Unilateral primary osteoarthritis, right knee: Secondary | ICD-10-CM | POA: Diagnosis not present

## 2017-10-27 DIAGNOSIS — M25461 Effusion, right knee: Secondary | ICD-10-CM | POA: Diagnosis not present

## 2019-07-31 DIAGNOSIS — N50812 Left testicular pain: Secondary | ICD-10-CM | POA: Diagnosis not present

## 2019-07-31 DIAGNOSIS — N50811 Right testicular pain: Secondary | ICD-10-CM | POA: Diagnosis not present

## 2019-08-28 ENCOUNTER — Encounter: Payer: Self-pay | Admitting: Family Medicine

## 2019-08-28 ENCOUNTER — Ambulatory Visit (INDEPENDENT_AMBULATORY_CARE_PROVIDER_SITE_OTHER): Payer: BC Managed Care – PPO | Admitting: Family Medicine

## 2019-08-28 ENCOUNTER — Other Ambulatory Visit: Payer: Self-pay | Admitting: Family Medicine

## 2019-08-28 ENCOUNTER — Other Ambulatory Visit: Payer: Self-pay

## 2019-08-28 VITALS — BP 124/84 | HR 55 | Temp 97.6°F | Resp 10 | Ht 72.0 in | Wt 225.2 lb

## 2019-08-28 DIAGNOSIS — E559 Vitamin D deficiency, unspecified: Secondary | ICD-10-CM | POA: Diagnosis not present

## 2019-08-28 DIAGNOSIS — Z113 Encounter for screening for infections with a predominantly sexual mode of transmission: Secondary | ICD-10-CM | POA: Diagnosis not present

## 2019-08-28 DIAGNOSIS — Z23 Encounter for immunization: Secondary | ICD-10-CM | POA: Diagnosis not present

## 2019-08-28 DIAGNOSIS — Z0000 Encounter for general adult medical examination without abnormal findings: Secondary | ICD-10-CM

## 2019-08-28 DIAGNOSIS — I10 Essential (primary) hypertension: Secondary | ICD-10-CM | POA: Diagnosis not present

## 2019-08-28 NOTE — Patient Instructions (Addendum)
Instead of Neurontin/gabapentin, look into Lyrica as an option for your nerve-related pain.  Also, please check your Blood pressures daily along with heart rates and write down numbers.   Bring in next ov for my review.  I see your not taking your meds, so lets make sure we check it regularly   Preventive Care, Male Preventive care refers to lifestyle choices and visits with your health care provider that can promote health and wellness. What does preventive care include?   A yearly physical exam. This is also called an annual well check.  Dental exams once or twice a year.  Routine eye exams. Ask your health care provider how often you should have your eyes checked.  Personal lifestyle choices, including: ? Daily care of your teeth and gums. ? Regular physical activity. ? Eating a healthy diet. ? Avoiding tobacco and drug use. ? Limiting alcohol use. ? Practicing safe sex. ? Taking low doses of aspirin every day. ? Taking vitamin and mineral supplements as recommended by your health care provider. What happens during an annual well check? The services and screenings done by your health care provider during your annual well check will depend on your age, overall health, lifestyle risk factors, and family history of disease. Counseling Your health care provider may ask you questions about your:  Alcohol use.  Tobacco use.  Drug use.  Emotional well-being.  Home and relationship well-being.  Sexual activity.  Eating habits.  History of falls.  Memory and ability to understand (cognition).  Work and work Statistician. Screening You may have the following tests or measurements:  Height, weight, and BMI.  Blood pressure.  Lipid and cholesterol levels. These may be checked every 5 years, or more frequently if you are over 61 years old.  Skin check.  Lung cancer screening. You may have this screening every year starting at age 28 if you have a 30-pack-year history  of smoking and currently smoke or have quit within the past 15 years.  Colorectal cancer screening. All adults should have this screening starting at age 12 and continuing until age 54. You will have tests every 1-10 years, depending on your results and the type of screening test. People at increased risk should start screening at an earlier age. Screening tests may include: ? Guaiac-based fecal occult blood testing. ? Fecal immunochemical test (FIT). ? Stool DNA test. ? Virtual colonoscopy. ? Sigmoidoscopy. During this test, a flexible tube with a tiny camera (sigmoidoscope) is used to examine your rectum and lower colon. The sigmoidoscope is inserted through your anus into your rectum and lower colon. ? Colonoscopy. During this test, a long, thin, flexible tube with a tiny camera (colonoscope) is used to examine your entire colon and rectum.  Prostate cancer screening. Recommendations will vary depending on your family history and other risks.  Hepatitis C blood test.  Hepatitis B blood test.  Sexually transmitted disease (STD) testing.  Diabetes screening. This is done by checking your blood sugar (glucose) after you have not eaten for a while (fasting). You may have this done every 1-3 years.  Abdominal aortic aneurysm (AAA) screening. You may need this if you are a current or former smoker.  Osteoporosis. You may be screened starting at age 34 if you are at high risk. Talk with your health care provider about your test results, treatment options, and if necessary, the need for more tests. Vaccines Your health care provider may recommend certain vaccines, such as:  Influenza vaccine. This  is recommended every year.  Tetanus, diphtheria, and acellular pertussis (Tdap, Td) vaccine. You may need a Td booster every 10 years.  Varicella vaccine. You may need this if you have not been vaccinated.  Zoster vaccine. You may need this after age 86.  Measles, mumps, and rubella (MMR)  vaccine. You may need at least one dose of MMR if you were born in 1957 or later. You may also need a second dose.  Pneumococcal 13-valent conjugate (PCV13) vaccine. One dose is recommended after age 81.  Pneumococcal polysaccharide (PPSV23) vaccine. One dose is recommended after age 43.  Meningococcal vaccine. You may need this if you have certain conditions.  Hepatitis A vaccine. You may need this if you have certain conditions or if you travel or work in places where you may be exposed to hepatitis A.  Hepatitis B vaccine. You may need this if you have certain conditions or if you travel or work in places where you may be exposed to hepatitis B.  Haemophilus influenzae type b (Hib) vaccine. You may need this if you have certain risk factors. Talk to your health care provider about which screenings and vaccines you need and how often you need them. This information is not intended to replace advice given to you by your health care provider. Make sure you discuss any questions you have with your health care provider. Document Released: 11/14/2015 Document Revised: 12/08/2017 Document Reviewed: 08/19/2015 Elsevier Interactive Patient Education  2019 [place] for Adults, Male A healthy lifestyle and preventive care can promote health and wellness. Preventive health guidelines for men include the following key practices:  A routine yearly physical is a good way to check with your health care provider about your health and preventative screening. It is a chance to share any concerns and updates on your health and to receive a thorough exam.  Visit your dentist for a routine exam and preventative care every 6 months. Brush your teeth twice a day and floss once a day. Good oral hygiene prevents tooth decay and gum disease.  The frequency of eye exams is based on your age, health, family medical history, use of contact lenses, and other factors. Follow your  health care provider's recommendations for frequency of eye exams.  Eat a healthy diet. Foods such as vegetables, fruits, whole grains, low-fat dairy products, and lean protein foods contain the nutrients you need without too many calories. Decrease your intake of foods high in solid fats, added sugars, and salt. Eat the right amount of calories for you. Get information about a proper diet from your health care provider, if necessary.  Regular physical exercise is one of the most important things you can do for your health. Most adults should get at least 150 minutes of moderate-intensity exercise (any activity that increases your heart rate and causes you to sweat) each week. In addition, most adults need muscle-strengthening exercises on 2 or more days a week.  Maintain a healthy weight. The body mass index (BMI) is a screening tool to identify possible weight problems. It provides an estimate of body fat based on height and weight. Your health care provider can find your BMI and can help you achieve or maintain a healthy weight. For adults 20 years and older:  A BMI below 18.5 is considered underweight.  A BMI of 18.5 to 24.9 is normal.  A BMI of 25 to 29.9 is considered overweight.  A BMI of  30 and above is considered obese.  Maintain normal blood lipids and cholesterol levels by exercising and minimizing your intake of saturated fat. Eat a balanced diet with plenty of fruit and vegetables. Blood tests for lipids and cholesterol should begin at age 38 and be repeated every 5 years. If your lipid or cholesterol levels are high, you are over 50, or you are at high risk for heart disease, you may need your cholesterol levels checked more frequently. Ongoing high lipid and cholesterol levels should be treated with medicines if diet and exercise are not working.  If you smoke, find out from your health care provider how to quit. If you do not use tobacco, do not start.  Lung cancer screening is  recommended for adults aged 28-80 years who are at high risk for developing lung cancer because of a history of smoking. A yearly low-dose CT scan of the lungs is recommended for people who have at least a 30-pack-year history of smoking and are a current smoker or have quit within the past 15 years. A pack year of smoking is smoking an average of 1 pack of cigarettes a day for 1 year (for example: 1 pack a day for 30 years or 2 packs a day for 15 years). Yearly screening should continue until the smoker has stopped smoking for at least 15 years. Yearly screening should be stopped for people who develop a health problem that would prevent them from having lung cancer treatment.  If you choose to drink alcohol, do not have more than 2 drinks per day. One drink is considered to be 12 ounces (355 mL) of beer, 5 ounces (148 mL) of wine, or 1.5 ounces (44 mL) of liquor.  Avoid use of street drugs. Do not share needles with anyone. Ask for help if you need support or instructions about stopping the use of drugs.  High blood pressure causes heart disease and increases the risk of stroke. Your blood pressure should be checked at least every 1-2 years. Ongoing high blood pressure should be treated with medicines, if weight loss and exercise are not effective.  If you are 68-49 years old, ask your health care provider if you should take aspirin to prevent heart disease.  Diabetes screening is done by taking a blood sample to check your blood glucose level after you have not eaten for a certain period of time (fasting). If you are not overweight and you do not have risk factors for diabetes, you should be screened once every 3 years starting at age 47. If you are overweight or obese and you are 52-27 years of age, you should be screened for diabetes every year as part of your cardiovascular risk assessment.  Colorectal cancer can be detected and often prevented. Most routine colorectal cancer screening begins at  the age of 44 and continues through age 27. However, your health care provider may recommend screening at an earlier age if you have risk factors for colon cancer. On a yearly basis, your health care provider may provide home test kits to check for hidden blood in the stool. Use of a small camera at the end of a tube to directly examine the colon (sigmoidoscopy or colonoscopy) can detect the earliest forms of colorectal cancer. Talk to your health care provider about this at age 20, when routine screening begins. Direct exam of the colon should be repeated every 5-10 years through age 55, unless early forms of precancerous polyps or small growths are found.  People who are at an increased risk for hepatitis B should be screened for this virus. You are considered at high risk for hepatitis B if:  You were born in a country where hepatitis B occurs often. Talk with your health care provider about which countries are considered high risk.  Your parents were born in a high-risk country and you have not received a shot to protect against hepatitis B (hepatitis B vaccine).  You have HIV or AIDS.  You use needles to inject street drugs.  You live with, or have sex with, someone who has hepatitis B.  You are a man who has sex with other men (MSM).  You get hemodialysis treatment.  You take certain medicines for conditions such as cancer, organ transplantation, and autoimmune conditions.  Hepatitis C blood testing is recommended for all people born from [birth year] through 1965 and any individual with known risks for hepatitis C.  Practice safe sex. Use condoms and avoid high-risk sexual practices to reduce the spread of sexually transmitted infections (STIs). STIs include gonorrhea, chlamydia, syphilis, trichomonas, herpes, HPV, and human immunodeficiency virus (HIV). Herpes, HIV, and HPV are viral illnesses that have no cure. They can result in disability, cancer, and death.  If you are a man who has sex  with other men, you should be screened at least once per year for:  HIV.  Urethral, rectal, and pharyngeal infection of gonorrhea, chlamydia, or both.  If you are at risk of being infected with HIV, it is recommended that you take a prescription medicine daily to prevent HIV infection. This is called preexposure prophylaxis (PrEP). You are considered at risk if:  You are a man who has sex with other men (MSM) and have other risk factors.  You are a heterosexual man, are sexually active, and are at increased risk for HIV infection.  You take drugs by injection.  You are sexually active with a partner who has HIV.  Talk with your health care provider about whether you are at high risk of being infected with HIV. If you choose to begin PrEP, you should first be tested for HIV. You should then be tested every 3 months for as long as you are taking PrEP.  A one-time screening for abdominal aortic aneurysm (AAA) and surgical repair of large AAAs by ultrasound are recommended for men ages 77 to 64 years who are current or former smokers.  Healthy men should no longer receive prostate-specific antigen (PSA) blood tests as part of routine cancer screening. Talk with your health care provider about prostate cancer screening.  Testicular cancer screening is not recommended for adult males who have no symptoms. Screening includes self-exam, a health care provider exam, and other screening tests. Consult with your health care provider about any symptoms you have or any concerns you have about testicular cancer.  Use sunscreen. Apply sunscreen liberally and repeatedly throughout the day. You should seek shade when your shadow is shorter than you. Protect yourself by wearing long sleeves, pants, a wide-brimmed hat, and sunglasses year round, whenever you are outdoors.  Once a month, do a whole-body skin exam, using a mirror to look at the skin on your back. Tell your health care provider about new moles,  moles that have irregular borders, moles that are larger than a pencil eraser, or moles that have changed in shape or color.  Stay current with required vaccines (immunizations).  Influenza vaccine. All adults should be immunized every year.  Tetanus, diphtheria, and acellular  pertussis (Td, Tdap) vaccine. An adult who has not previously received Tdap or who does not know his vaccine status should receive 1 dose of Tdap. This initial dose should be followed by tetanus and diphtheria toxoids (Td) booster doses every 10 years. Adults with an unknown or incomplete history of completing a 3-dose immunization series with Td-containing vaccines should begin or complete a primary immunization series including a Tdap dose. Adults should receive a Td booster every 10 years.  Varicella vaccine. An adult without evidence of immunity to varicella should receive 2 doses or a second dose if he has previously received 1 dose.  Human papillomavirus (HPV) vaccine. Males aged 11-21 years who have not received the vaccine previously should receive the 3-dose series. Males aged 22-26 years may be immunized. Immunization is recommended through the age of 35 years for any male who has sex with males and did not get any or all doses earlier. Immunization is recommended for any person with an immunocompromised condition through the age of 69 years if he did not get any or all doses earlier. During the 3-dose series, the second dose should be obtained 4-8 weeks after the first dose. The third dose should be obtained 24 weeks after the first dose and 16 weeks after the second dose.  Zoster vaccine. One dose is recommended for adults aged 41 years or older unless certain conditions are present.  Measles, mumps, and rubella (MMR) vaccine. Adults born before [birth year] generally are considered immune to measles and mumps. Adults born in [birth year] or later should have 1 or more doses of MMR vaccine unless there is a contraindication to the  vaccine or there is laboratory evidence of immunity to each of the three diseases. A routine second dose of MMR vaccine should be obtained at least 28 days after the first dose for students attending postsecondary schools, health care workers, or international travelers. People who received inactivated measles vaccine or an unknown type of measles vaccine during 1963-1967 should receive 2 doses of MMR vaccine. People who received inactivated mumps vaccine or an unknown type of mumps vaccine before 1979 and are at high risk for mumps infection should consider immunization with 2 doses of MMR vaccine. Unvaccinated health care workers born before [birth year] who lack laboratory evidence of measles, mumps, or rubella immunity or laboratory confirmation of disease should consider measles and mumps immunization with 2 doses of MMR vaccine or rubella immunization with 1 dose of MMR vaccine.  Pneumococcal 13-valent conjugate (PCV13) vaccine. When indicated, a person who is uncertain of his immunization history and has no record of immunization should receive the PCV13 vaccine. All adults 23 years of age and older should receive this vaccine. An adult aged 95 years or older who has certain medical conditions and has not been previously immunized should receive 1 dose of PCV13 vaccine. This PCV13 should be followed with a dose of pneumococcal polysaccharide (PPSV23) vaccine. Adults who are at high risk for pneumococcal disease should obtain the PPSV23 vaccine at least 8 weeks after the dose of PCV13 vaccine. Adults older than 49 years of age who have normal immune system function should obtain the PPSV23 vaccine dose at least 1 year after the dose of PCV13 vaccine.  Pneumococcal polysaccharide (PPSV23) vaccine. When PCV13 is also indicated, PCV13 should be obtained first. All adults aged 33 years and older should be immunized. An adult younger than age 95 years who has certain medical conditions should be immunized. Any person  who resides in a  nursing home or long-term care facility should be immunized. An adult smoker should be immunized. People with an immunocompromised condition and certain other conditions should receive both PCV13 and PPSV23 vaccines. People with human immunodeficiency virus (HIV) infection should be immunized as soon as possible after diagnosis. Immunization during chemotherapy or radiation therapy should be avoided. Routine use of PPSV23 vaccine is not recommended for American Indians, [place] Natives, or people younger than 65 years unless there are medical conditions that require PPSV23 vaccine. When indicated, people who have unknown immunization and have no record of immunization should receive PPSV23 vaccine. One-time revaccination 5 years after the first dose of PPSV23 is recommended for people aged 19-64 years who have chronic kidney failure, nephrotic syndrome, asplenia, or immunocompromised conditions. People who received 1-2 doses of PPSV23 before age 12 years should receive another dose of PPSV23 vaccine at age 68 years or later if at least 5 years have passed since the previous dose. Doses of PPSV23 are not needed for people immunized with PPSV23 at or after age 31 years.  Meningococcal vaccine. Adults with asplenia or persistent complement component deficiencies should receive 2 doses of quadrivalent meningococcal conjugate (MenACWY-D) vaccine. The doses should be obtained at least 2 months apart. Microbiologists working with certain meningococcal bacteria, [place] recruits, people at risk during an outbreak, and people who travel to or live in countries with a high rate of meningitis should be immunized. A first-year college student up through age 68 years who is living in a residence hall should receive a dose if he did not receive a dose on or after his 16th birthday. Adults who have certain high-risk conditions should receive one or more doses of vaccine.  Hepatitis A vaccine. Adults who wish  to be protected from this disease, have chronic liver disease, work with hepatitis A-infected animals, work in hepatitis A research labs, or travel to or work in countries with a high rate of hepatitis A should be immunized. Adults who were previously unvaccinated and who anticipate close contact with an international adoptee during the first 60 days after arrival in the [place] from a country with a high rate of hepatitis A should be immunized.  Hepatitis B vaccine. Adults should be immunized if they wish to be protected from this disease, are under age 102 years and have diabetes, have chronic liver disease, have had more than one sex partner in the past 6 months, may be exposed to blood or other infectious body fluids, are household contacts or sex partners of hepatitis B positive people, are clients or workers in certain care facilities, or travel to or work in countries with a high rate of hepatitis B.  Haemophilus influenzae type b (Hib) vaccine. A previously unvaccinated person with asplenia or sickle cell disease or having a scheduled splenectomy should receive 1 dose of Hib vaccine. Regardless of previous immunization, a recipient of a hematopoietic stem cell transplant should receive a 3-dose series 6-12 months after his successful transplant. Hib vaccine is not recommended for adults with HIV infection. Preventive Service / Frequency Ages 35 to 78  Blood pressure check.** / Every 3-5 years.  Lipid and cholesterol check.** / Every 5 years beginning at age 64.  Hepatitis C blood test.** / For any individual with known risks for hepatitis C.  Skin self-exam. / Monthly.  Influenza vaccine. / Every year.  Tetanus, diphtheria, and acellular pertussis (Tdap, Td) vaccine.** / Consult your health care provider. 1 dose of Td every 10 years.  Varicella  vaccine.** / Consult your health care provider.  HPV vaccine. / 3 doses over 6 months, if 71 or younger.  Measles, mumps, rubella (MMR)  vaccine.** / You need at least 1 dose of MMR if you were born in 1957 or later. You may also need a second dose.  Pneumococcal 13-valent conjugate (PCV13) vaccine.** / Consult your health care provider.  Pneumococcal polysaccharide (PPSV23) vaccine.** / 1 to 2 doses if you smoke cigarettes or if you have certain conditions.  Meningococcal vaccine.** / 1 dose if you are age 49 to 64 years and a Market researcher living in a residence hall, or have one of several medical conditions. You may also need additional booster doses.  Hepatitis A vaccine.** / Consult your health care provider.  Hepatitis B vaccine.** / Consult your health care provider.  Haemophilus influenzae type b (Hib) vaccine.** / Consult your health care provider. Ages 30 to 43  Blood pressure check.** / Every year.  Lipid and cholesterol check.** / Every 5 years beginning at age 41.  Lung cancer screening. / Every year if you are aged 33-80 years and have a 30-pack-year history of smoking and currently smoke or have quit within the past 15 years. Yearly screening is stopped once you have quit smoking for at least 15 years or develop a health problem that would prevent you from having lung cancer treatment.  Fecal occult blood test (FOBT) of stool. / Every year beginning at age 78 and continuing until age 38. You may not have to do this test if you get a colonoscopy every 10 years.  Flexible sigmoidoscopy** or colonoscopy.** / Every 5 years for a flexible sigmoidoscopy or every 10 years for a colonoscopy beginning at age 54 and continuing until age 67.  Hepatitis C blood test.** / For all people born from [birth year] through 1965 and any individual with known risks for hepatitis C.  Skin self-exam. / Monthly.  Influenza vaccine. / Every year.  Tetanus, diphtheria, and acellular pertussis (Tdap/Td) vaccine.** / Consult your health care provider. 1 dose of Td every 10 years.  Varicella vaccine.** / Consult your health  care provider.  Zoster vaccine.** / 1 dose for adults aged 88 years or older.  Measles, mumps, rubella (MMR) vaccine.** / You need at least 1 dose of MMR if you were born in 1957 or later. You may also need a second dose.  Pneumococcal 13-valent conjugate (PCV13) vaccine.** / Consult your health care provider.  Pneumococcal polysaccharide (PPSV23) vaccine.** / 1 to 2 doses if you smoke cigarettes or if you have certain conditions.  Meningococcal vaccine.** / Consult your health care provider.  Hepatitis A vaccine.** / Consult your health care provider.  Hepatitis B vaccine.** / Consult your health care provider.  Haemophilus influenzae type b (Hib) vaccine.** / Consult your health care provider. Ages 30 and over  Blood pressure check.** / Every year.  Lipid and cholesterol check.**/ Every 5 years beginning at age 13.  Lung cancer screening. / Every year if you are aged 62-80 years and have a 30-pack-year history of smoking and currently smoke or have quit within the past 15 years. Yearly screening is stopped once you have quit smoking for at least 15 years or develop a health problem that would prevent you from having lung cancer treatment.  Fecal occult blood test (FOBT) of stool. / Every year beginning at age 86 and continuing until age 34. You may not have to do this test if you get a colonoscopy every  10 years.  Flexible sigmoidoscopy** or colonoscopy.** / Every 5 years for a flexible sigmoidoscopy or every 10 years for a colonoscopy beginning at age 18 and continuing until age 42.  Hepatitis C blood test.** / For all people born from [birth year] through 1966 and any individual with known risks for hepatitis C.  Abdominal aortic aneurysm (AAA) screening.** / A one-time screening for ages 22 to 37 years who are current or former smokers.  Skin self-exam. / Monthly.  Influenza vaccine. / Every year.  Tetanus, diphtheria, and acellular pertussis (Tdap/Td) vaccine.** / 1 dose of Td  every 10 years.  Varicella vaccine.** / Consult your health care provider.  Zoster vaccine.** / 1 dose for adults aged 56 years or older.  Pneumococcal 13-valent conjugate (PCV13) vaccine.** / 1 dose for all adults aged 42 years and older.  Pneumococcal polysaccharide (PPSV23) vaccine.** / 1 dose for all adults aged 16 years and older.  Meningococcal vaccine.** / Consult your health care provider.  Hepatitis A vaccine.** / Consult your health care provider.  Hepatitis B vaccine.** / Consult your health care provider.  Haemophilus influenzae type b (Hib) vaccine.** / Consult your health care provider. **Family history and personal history of risk and conditions may change your health care provider's recommendations.   This information is not intended to replace advice given to you by your health care provider. Make sure you discuss any questions you have with your health care provider.   Document Released: 12/14/2001 Document Revised: 11/08/2014 Document Reviewed: 03/15/2011 Elsevier Interactive Patient Education [employer].

## 2019-08-28 NOTE — Progress Notes (Signed)
Male physical  Impression and Recommendations:    1. Vitamin D deficiency   2. Healthcare maintenance   3. Essential hypertension      1) Anticipatory Guidance: Discussed importance of wearing a seatbelt while driving, not texting while driving;   sunscreen when outside along with skin surveillance; eating a balanced and modest diet; physical activity at least 25 minutes per day or 150 min/ week moderate to intense activity.  - Discussed prudent home skin surveillance with patient today.  - Prudent self-testicular screening discussed with patient today.  2) Immunizations / Screenings / Labs:   All immunizations are up-to-date per recommendations or will be updated today. Patient is due for dental and vision screens which pt will schedule independently. Will obtain CBC, CMP, HgA1c, Lipid panel, TSH and vit D when fasting, if not already done recently.   - Fasting lab work obtained today.  - Extensive education provided today regarding need for influenza vaccination.  - Advised patient to continue to follow up with urology PRN and as advised. - Explained that patient may obtain PT for pelvic floor through urology.  - STD screen obtained today per pt request.  3) Weight - Body mass index is 30.54 kg/m BMI meaning discussed with patient.  Discussed goal of losing 5-10% of current body weight which would improve overall feelings of well being and improve objective health data. Improve nutrient density of diet through increasing intake of fruits and vegetables and decreasing saturated fats, white flour products and refined sugars.   Recommendations - Return next available OV to review recent lab work.   Orders Placed This Encounter  Procedures  . CBC w/Diff  . Comprehensive metabolic panel    Order Specific Question:   Has the patient fasted?    Answer:   No  . Lipid panel    Order Specific Question:   Has the patient fasted?    Answer:   No  . TSH  . T4, free  .  Hemoglobin A1c  . VITAMIN D 25 Hydroxy (Vit-D Deficiency, Fractures)    No orders of the defined types were placed in this encounter.    Return for next available OV to review fasting blood work.    Gross side effects, risk and benefits, and alternatives of medications discussed with patient.  Patient is aware that all medications have potential side effects and we are unable to predict every side effect or drug-drug interaction that may occur.  Expresses verbal understanding and consents to current therapy plan and treatment regimen.  Please see AVS handed out to patient at the end of our visit for further patient instructions/ counseling done pertaining to today's office visit.  Follow-up preventative CPE in 1 year. Follow-up office visit - may be needed pending lab work.   - F/up sooner for chronic care management as above and/or prn  This document serves as a record of services personally performed by Thomasene Lot, DO. It was created on her behalf by Peggye Fothergill, a trained medical scribe. The creation of this record is based on the scribe's personal observations and the provider's statements to them.   This case required medical decision making of at least moderate complexity. The above documentation has been reviewed to be accurate and was completed by Carlye Grippe, D.O.         Subjective:     I, Peggye Fothergill, am serving as scribe for Dr. Thomasene Lot.  CC: CPE  HPI: Bradley Burns  is a 48 y.o. male who [facility] health maintenance exam.     Health Maintenance Summary Reviewed and updated, unless pt declines services.  Colonoscopy:  Denies known history of colon cancer in first or second degree relatives. Tobacco History Reviewed:  Never smoker. Alcohol:    No concerns, no excessive use. Exercise Habits:  Not meeting AHA guidelines. STD concerns:  None; monogamous with wife.  Drug Use:  None. Birth control method:  None reported. Testicular/penile concerns:  Has still been experiencing tenderness.  Went to urologist two weeks ago; "he said it was a nerve issue."  Notes "it's real tender like it was last time," in the testicular area.  Says "it's there and it will come up and go to the back."  Notes he is now taking gabapentin to treat this pain.  Says "it's just the one that hurts," when it is touched.  States he hasn't had the flu shot since 1994, when he got "real sick."  - Urology Notes urologist "said everything felt normal."  - Eye Health Thinks his last visit to the eye doctor was 3-5 years ago. 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  - General Health Denies diarrhea, constipation, GI discomfort.  States his only pain is the nerve pain in the genital region as previously discussed.  Denies a burning sensation or nausea along with the pain; states "it just feels like someone is grabbing me," and "it hurts."    Immunization History  Administered Date(s) Administered  . Tdap 06/01/2017    Health Maintenance  Topic Date Due  . INFLUENZA VACCINE  01/30/2020 (Originally 06/02/2019)  . TETANUS/TDAP  06/02/2027  . HIV Screening  Completed      Wt Readings from Last 3 Encounters:  08/28/19 225 lb 3.2 oz (102.2 kg)  06/01/17 237 lb 4.8 oz (107.6 kg)  05/25/17 267 lb (121.1 kg)   BP Readings from Last 3 Encounters:  08/28/19 (!) 138/92  06/01/17 127/86  05/25/17 125/85   Pulse Readings from Last 3 Encounters:  08/28/19 66  06/01/17 62  05/25/17 (!) 53    Patient Active Problem List   Diagnosis Date Noted  . Vitamin D deficiency 06/03/2017  . Hypertension 05/25/2017  . Seasonal allergies 05/25/2017  . History of smoking for 2-5 years 05/25/2017  . Social alcohol use 05/25/2017  . Obesity, Class II, BMI 35-39.9, with comorbidity 05/25/2017  . S/P appendectomy  05/25/2017  . GERD (gastroesophageal reflux disease) 05/25/2017    Past Medical History:  Diagnosis Date  . Allergy   . Hypertension     Past Surgical History:  Procedure Laterality Date  . ANKLE ARTHROSCOPY Bilateral   . APPENDECTOMY    . NASAL SEPTUM SURGERY    . SHOULDER ARTHROSCOPY Left     Family History  Problem Relation Age of Onset  . Hypertension Father     Social History   Substance and Sexual Activity  Drug Use No  ,  Social History   Substance and Sexual Activity  Alcohol Use Yes  . Alcohol/week: 4.0 standard drinks  . Types: 4 Standard drinks or equivalent per week  ,  Social History   Tobacco Use  Smoking Status Never Smoker  Smokeless Tobacco Never Used  ,  Social History   Substance and Sexual Activity  Sexual Activity Not Currently    Patient's  Medications  New Prescriptions   No medications on file  Previous Medications   CHOLECALCIFEROL (VITAMIN D3) 5000 UNITS TABS    5,000 IU OTC vitamin D3 daily.   FLUTICASONE (FLONASE) 50 MCG/ACT NASAL SPRAY    Place 2 sprays into both nostrils as needed for allergies or rhinitis.   GABAPENTIN (NEURONTIN) 300 MG CAPSULE    Take 300 mg by mouth 3 (three) times daily.   HYDROCHLOROTHIAZIDE (HYDRODIURIL) 12.5 MG TABLET    Take 1 tablet (12.5 mg total) by mouth daily.   VITAMIN D, ERGOCALCIFEROL, (DRISDOL) 50000 UNITS CAPS CAPSULE    Take 1 capsule (50,000 Units total) by mouth every 7 (seven) days.  Modified Medications   No medications on file  Discontinued Medications   No medications on file    Patient has no allergy information on record.  Review of Systems: General:   Denies fever, chills, unexplained weight loss.  Optho/Auditory:   Denies visual changes, blurred vision/LOV Respiratory:   Denies SOB, DOE more than baseline levels.   Cardiovascular:   Denies chest pain, palpitations, new onset peripheral edema  Gastrointestinal:   Denies nausea, vomiting, diarrhea.  Genitourinary: Denies  dysuria, freq/ urgency, flank pain or discharge from genitals.  Endocrine:     Denies hot or cold intolerance, polyuria, polydipsia. Musculoskeletal:   Denies unexplained myalgias, joint swelling, unexplained arthralgias, gait problems.  Skin:  Denies rash, suspicious lesions Neurological:     Denies dizziness, unexplained weakness, numbness  Psychiatric/Behavioral:   Denies mood changes, suicidal or homicidal ideations, hallucinations    Objective:     Blood pressure (!) 138/92, pulse 66, temperature 97.6 F (36.4 C), temperature source Oral, resp. rate 10, height 6' (1.829 m), weight 225 lb 3.2 oz (102.2 kg), SpO2 98 %. Body mass index is 30.54 kg/m. General Appearance:    Alert, cooperative, no distress, appears stated age  Head:    Normocephalic, without obvious abnormality, atraumatic  Eyes:    PERRL, conjunctiva/corneas clear, EOM's intact, fundi    benign, both eyes  Ears:    Normal TM's and external ear canals, both ears  Nose:   Nares normal, septum midline, mucosa normal, no drainage    or sinus tenderness  Throat:   Lips w/o lesion, mucosa moist, and tongue normal; teeth and   gums normal  Neck:   Supple, symmetrical, trachea midline, no adenopathy;    thyroid:  no enlargement/tenderness/nodules; no carotid   bruit or JVD  Back:     Symmetric, no curvature, ROM normal, no CVA tenderness  Lungs:     Clear to auscultation bilaterally, respirations unlabored, no       Wh/ R/ R  Chest Wall:    No tenderness or gross deformity; normal excursion   Heart:    Regular rate and rhythm, S1 and S2 normal, no murmur, rub   or gallop  Abdomen:     Soft, non-tender, bowel sounds active all four quadrants, NO   G/R/R, no masses, no organomegaly  Genitalia:     Bilateral testes exquisitely tender to light palpation; unable to perform testicular exam due to patient discomfort, hypersensitivity.  Ext genitalia otherwise without lesion, no penile rash or discharge, no hernias appreciated    Rectal:    Normal tone, prostate WNL's and equal b/l, no tenderness; guaiac negative stool  Extremities:   Extremities normal, atraumatic, no cyanosis or gross edema  Pulses:   2+ and symmetric all extremities  Skin:   Warm, dry, Skin color, texture, turgor  normal, no obvious rashes or lesions  M-Sk:   Ambulates * 4 w/o difficulty, no gross deformities, tone WNL  Neurologic:   CNII-XII intact, normal strength, sensation and reflexes    Throughout Psych:  No HI/SI, judgement and insight good, Euthymic mood. Full Affect.

## 2019-08-30 LAB — CBC WITH DIFFERENTIAL/PLATELET
Basophils Absolute: 0 10*3/uL (ref 0.0–0.2)
Basos: 1 %
EOS (ABSOLUTE): 0.1 10*3/uL (ref 0.0–0.4)
Eos: 2 %
Hematocrit: 47.7 % (ref 37.5–51.0)
Hemoglobin: 16.3 g/dL (ref 13.0–17.7)
Immature Grans (Abs): 0 10*3/uL (ref 0.0–0.1)
Immature Granulocytes: 0 %
Lymphocytes Absolute: 1.5 10*3/uL (ref 0.7–3.1)
Lymphs: 29 %
MCH: 30 pg (ref 26.6–33.0)
MCHC: 34.2 g/dL (ref 31.5–35.7)
MCV: 88 fL (ref 79–97)
Monocytes Absolute: 0.4 10*3/uL (ref 0.1–0.9)
Monocytes: 8 %
Neutrophils Absolute: 3.3 10*3/uL (ref 1.4–7.0)
Neutrophils: 60 %
Platelets: 245 10*3/uL (ref 150–450)
RBC: 5.43 x10E6/uL (ref 4.14–5.80)
RDW: 12.8 % (ref 11.6–15.4)
WBC: 5.4 10*3/uL (ref 3.4–10.8)

## 2019-08-30 LAB — LIPID PANEL
Chol/HDL Ratio: 3.9 ratio (ref 0.0–5.0)
Cholesterol, Total: 206 mg/dL — ABNORMAL HIGH (ref 100–199)
HDL: 53 mg/dL (ref >39)
LDL Chol Calc (NIH): 129 mg/dL — ABNORMAL HIGH (ref 0–99)
Triglycerides: 134 mg/dL (ref 0–149)
VLDL Cholesterol Cal: 24 mg/dL (ref 5–40)

## 2019-08-30 LAB — COMPREHENSIVE METABOLIC PANEL
ALT: 26 IU/L (ref 0–44)
AST: 23 IU/L (ref 0–40)
Albumin/Globulin Ratio: 1.7 (ref 1.2–2.2)
Albumin: 4.7 g/dL (ref 4.0–5.0)
Alkaline Phosphatase: 73 IU/L (ref 39–117)
BUN/Creatinine Ratio: 9 (ref 9–20)
BUN: 7 mg/dL (ref 6–24)
Bilirubin Total: 0.3 mg/dL (ref 0.0–1.2)
CO2: 22 mmol/L (ref 20–29)
Calcium: 9.4 mg/dL (ref 8.7–10.2)
Chloride: 102 mmol/L (ref 96–106)
Creatinine, Ser: 0.78 mg/dL (ref 0.76–1.27)
GFR calc Af Amer: 123 mL/min/{1.73_m2} (ref >59)
GFR calc non Af Amer: 106 mL/min/{1.73_m2} (ref >59)
Globulin, Total: 2.7 g/dL (ref 1.5–4.5)
Glucose: 86 mg/dL (ref 65–99)
Potassium: 4.4 mmol/L (ref 3.5–5.2)
Sodium: 139 mmol/L (ref 134–144)
Total Protein: 7.4 g/dL (ref 6.0–8.5)

## 2019-08-30 LAB — HSV(HERPES SMPLX)ABS-I+II(IGG+IGM)-BLD
HSV 1 Glycoprotein G Ab, IgG: 49.4 index — ABNORMAL HIGH (ref 0.00–0.90)
HSV 2 IgG, Type Spec: <0.91 index (ref 0.00–0.90)
HSVI/II Comb IgM: <0.91 Ratio (ref 0.00–0.90)

## 2019-08-30 LAB — HEMOGLOBIN A1C
Est. average glucose Bld gHb Est-mCnc: 100 mg/dL
Hgb A1c MFr Bld: 5.1 % (ref 4.8–5.6)

## 2019-08-30 LAB — CHLAMYDIA/GONOCOCCUS/TRICHOMONAS, NAA
Chlamydia by NAA: NEGATIVE
Gonococcus by NAA: NEGATIVE
Trich vag by NAA: NEGATIVE

## 2019-08-30 LAB — T4, FREE: Free T4: 1.09 ng/dL (ref 0.82–1.77)

## 2019-08-30 LAB — TSH: TSH: 2.6 u[IU]/mL (ref 0.450–4.500)

## 2019-08-30 LAB — HIV ANTIBODY (ROUTINE TESTING W REFLEX): HIV Screen 4th Generation wRfx: NONREACTIVE

## 2019-08-30 LAB — VITAMIN D 25 HYDROXY (VIT D DEFICIENCY, FRACTURES): Vit D, 25-Hydroxy: 23.7 ng/mL — ABNORMAL LOW (ref 30.0–100.0)

## 2019-08-30 LAB — HEPATITIS C ANTIBODY: Hep C Virus Ab: <0.1 s/co ratio (ref 0.0–0.9)

## 2019-08-30 LAB — RPR: RPR Ser Ql: NONREACTIVE

## 2019-09-04 ENCOUNTER — Encounter: Payer: Self-pay | Admitting: Family Medicine

## 2019-09-04 ENCOUNTER — Ambulatory Visit (INDEPENDENT_AMBULATORY_CARE_PROVIDER_SITE_OTHER): Payer: BC Managed Care – PPO | Admitting: Family Medicine

## 2019-09-04 ENCOUNTER — Other Ambulatory Visit: Payer: Self-pay

## 2019-09-04 VITALS — BP 127/85 | HR 64 | Temp 98.4°F | Resp 10 | Ht 72.0 in | Wt 233.2 lb

## 2019-09-04 DIAGNOSIS — N50812 Left testicular pain: Secondary | ICD-10-CM | POA: Diagnosis not present

## 2019-09-04 DIAGNOSIS — E559 Vitamin D deficiency, unspecified: Secondary | ICD-10-CM | POA: Diagnosis not present

## 2019-09-04 DIAGNOSIS — M62838 Other muscle spasm: Secondary | ICD-10-CM | POA: Diagnosis not present

## 2019-09-04 DIAGNOSIS — E785 Hyperlipidemia, unspecified: Secondary | ICD-10-CM

## 2019-09-04 DIAGNOSIS — E669 Obesity, unspecified: Secondary | ICD-10-CM

## 2019-09-04 DIAGNOSIS — R102 Pelvic and perineal pain: Secondary | ICD-10-CM | POA: Diagnosis not present

## 2019-09-04 DIAGNOSIS — I10 Essential (primary) hypertension: Secondary | ICD-10-CM | POA: Diagnosis not present

## 2019-09-04 DIAGNOSIS — M6281 Muscle weakness (generalized): Secondary | ICD-10-CM | POA: Diagnosis not present

## 2019-09-04 MED ORDER — VITAMIN D (ERGOCALCIFEROL) 1.25 MG (50000 UNIT) PO CAPS: ORAL_CAPSULE | ORAL | 3 refills | Status: DC

## 2019-09-04 NOTE — Patient Instructions (Signed)

## 2019-09-04 NOTE — Progress Notes (Signed)
Assessment and plan:  1. Essential hypertension   2. Hyperlipidemia, unspecified hyperlipidemia type   3. Vitamin D deficiency   4. Obesity (BMI 30-39.9)     - Advised patient that he should return for follow-up twice yearly, once for a yearly physical, and again for chronic follow-up.  - Reviewed recent lab work (08/28/2019) in depth with patient today.  All lab work within normal limits unless otherwise noted.  Extensive education provided and all questions answered.  - To help preserve organ health, encouraged patient to exercise regularly, hydrate adequately, avoid nephrotoxic and hepatotoxic substances, and maintain normal blood pressure and blood sugar.   HbA1c - WNL - Last checked at 5.1. - Discussed that as patient's sugars are prone to go lower, he should make sure to eat more regularly, to avoid low blood sugars.  Advised patient to avoid going long periods of time without eating. - Will continue to monitor.   Vitamin D Deficiency  - Up to 23.7 from 18.9 prior; still too low. - Discussed goal range of 40-60. - Reviewed that low Vitamin D may cause feelings of aches and fatigue. - Patient agrees to begin once-weekly supplementation.  See med list. - Will continue to monitor and re-check in 6 months as recommended.   Hyperlipidemia Triglycerides = 134, up from 56 prior. HDL = 53, up from 48 prior. LDL = 129, elevated, down from 141 last check.  The 10-year ASCVD risk score Denman George(Goff [place] Montez HagemanJr., et al., 2013) is: 3%   Values used to calculate the score:     Age: [age] years     Sex: Male     Is Non-Hispanic African American: No     Diabetic: No     Tobacco smoker: No     Systolic Blood Pressure: 127 mmHg     Is BP treated: No     HDL Cholesterol: 53 mg/dL     Total Cholesterol: 206 mg/dL  - Cholesterol levels slightly improved from prior, but not ideal.  - Prudent dietary changes such as low saturated &  trans fat diets for hyperlipidemia and low carb diets for hypertriglyceridemia discussed with patient.    - To help improve HDL, encouraged patient to follow AHA guidelines for regular exercise and also engage in weight loss if BMI above 25.   - Educational handouts provided at patient's desire and/ or told to look online at the American Heart Association website for further information.  - We will continue to monitor   Essential Hypertension - BP stable on intake today. - Advised patient to keep an eye on his BP at home. - Discussed goal of under 140/90 on a regular basis. - Will continue to monitor.   BMI Counseling - Body mass index is 31.63 kg/m Explained to patient what BMI refers to, and what it means medically.    Told patient to think about it as a "medical risk stratification measurement" and how increasing BMI is associated with increasing risk/ or worsening state of various diseases such as hypertension, hyperlipidemia, diabetes, premature OA, depression etc.  American Heart Association guidelines for healthy diet, basically Mediterranean diet, and exercise guidelines of 30 minutes 5 days per week or more discussed in detail.  Health counseling performed.  All questions answered.   Lifestyle & Preventative Health Maintenance - Advised patient to continue working toward exercising to improve overall mental, physical, and emotional health.    - Reviewed the "spokes of the wheel" of  mood and health management.  Stressed the importance of ongoing prudent habits, including regular exercise, appropriate sleep hygiene, healthful dietary habits, and prayer/meditation to relax.  - Encouraged patient to engage in daily physical activity, especially a formal exercise routine.  Recommended that the patient eventually strive for at least 150 minutes of moderate cardiovascular activity per week according to guidelines established by the [facility].   - Healthy dietary habits encouraged,  including low-carb, and high amounts of lean protein in diet.   - Patient should also consume adequate amounts of water.    Education and routine counseling performed. Handouts provided.   Meds ordered this encounter  Medications  . Vitamin D, Ergocalciferol, (DRISDOL) 1.25 MG (50000 UT) CAPS capsule    Sig: Take one tablet wkly    Dispense:  12 capsule    Refill:  3    Medications Discontinued During This Encounter  Medication Reason  . Cholecalciferol (VITAMIN D3) 5000 units TABS Error  . fluticasone (FLONASE) 50 MCG/ACT nasal spray Error  . hydrochlorothiazide (HYDRODIURIL) 12.5 MG tablet Error  . Vitamin D, Ergocalciferol, (DRISDOL) 50000 units CAPS capsule Error     Return for follow-up in 6 months as advised, Vit D re-check, BP, weight.   Anticipatory guidance and routine counseling done re: condition, txmnt options and need for follow up. All questions of patient's were answered.   Gross side effects, risk and benefits, and alternatives of medications discussed with patient.  Patient is aware that all medications have potential side effects and we are unable to predict every sideeffect or drug-drug interaction that may occur.  Expresses verbal understanding and consents to current therapy plan and treatment regiment.  Please see AVS handed out to patient at the end of our visit for additional patient instructions/ counseling done pertaining to today's office visit.  Note:  This document was prepared using Dragon voice recognition software and may include unintentional dictation errors.  This document serves as a record of services personally performed by Thomasene Lot, DO. It was created on her behalf by Peggye Fothergill, a trained medical scribe. The creation of this record is based on the scribe's personal observations and the provider's statements to them.   This case required medical decision making of at least moderate complexity. The above documentation has  been reviewed to be accurate and was completed by Carlye Grippe, D.O.    ----------------------------------------------------------------------------------------------------------------------  Subjective:   Bradley Burns, am serving as scribe for Dr. Thomasene Lot.  CC:   Bradley Burns is a 48 y.o. male who presents to [facility] today for review and discussion of recent bloodwork that was done in addition to f/up on chronic conditions we are managing for pt.  1. All recent blood work that we ordered was reviewed with patient today.  Patient was counseled on all abnormalities and we discussed dietary and lifestyle changes that could help those values (also medications when appropriate).  Extensive health counseling performed and all patient's concerns/ questions were addressed.  See labs below and also plan for more details of these abnormalities  Notes he's still waiting on his blood pressure meter to arrive.  Denies unusual dizziness, visual changes.  Says a few weeks ago before his physical, he had an "equilibrium issue," and he thinks his blood sugar was low because he was "cutting all his sugars out" and dieting.  Notes was eating apples, bananas, eggs, for a couple of months.  He lost 25 lbs.  In 7 of 2018, he weighed 267 lbs; he is now down to 233 lbs.  He cut [employer] out a year and a half ago.  Confirms he has been drinking more water.   Wt Readings from Last 3 Encounters:  09/04/19 233 lb 3.2 oz (105.8 kg)  08/28/19 225 lb 3.2 oz (102.2 kg)  06/01/17 237 lb 4.8 oz (107.6 kg)   BP Readings from Last 3 Encounters:  09/04/19 127/85  08/28/19 124/84  06/01/17 127/86   Pulse Readings from Last 3 Encounters:  09/04/19 64  08/28/19 (!) 55  06/01/17 62   BMI Readings from Last 3 Encounters:  09/04/19 31.63 kg/m  08/28/19 30.54 kg/m  06/01/17 32.18 kg/m     Patient Care Team    Relationship Specialty Notifications  Start End  Thomasene Lot, DO PCP - General Family Medicine  05/25/17   Keturah Barre, MD Consulting Physician Otolaryngology  05/25/17     Full medical history updated and reviewed in the office today  Patient Active Problem List   Diagnosis Date Noted  . Hyperlipidemia 09/08/2019    Priority: High  . Hypertension 05/25/2017    Priority: High  . Social alcohol use 05/25/2017    Priority: Medium  . GERD (gastroesophageal reflux disease) 05/25/2017    Priority: Medium  . Vitamin D deficiency 06/03/2017    Priority: Low  . Seasonal allergies 05/25/2017    Priority: Low  . Obesity (BMI 30-39.9) 05/25/2017    Priority: Low  . History of smoking for 2-5 years 05/25/2017  . S/P appendectomy 05/25/2017    Past Medical History:  Diagnosis Date  . Allergy   . Hypertension     Past Surgical History:  Procedure Laterality Date  . ANKLE ARTHROSCOPY Bilateral   . APPENDECTOMY    . NASAL SEPTUM SURGERY    . SHOULDER ARTHROSCOPY Left     Social History   Tobacco Use  . Smoking status: Never Smoker  . Smokeless tobacco: Never Used  Substance Use Topics  . Alcohol use: Yes    Alcohol/week: 4.0 standard drinks    Types: 4 Standard drinks or equivalent per week    Family Hx: Family History  Problem Relation Age of Onset  . Hypertension Father      Medications: Current Outpatient Medications  Medication Sig Dispense Refill  . gabapentin (NEURONTIN) 300 MG capsule Take 300 mg by mouth 3 (three) times daily.    . Vitamin D, Ergocalciferol, (DRISDOL) 1.25 MG (50000 UT) CAPS capsule Take one tablet wkly 12 capsule 3   No current facility-administered medications for this visit.     Allergies:  Not on File   Review of Systems: General:   No F/C, wt loss Pulm:   No DIB, SOB, pleuritic chest pain Card:  No CP, palpitations Abd:  No n/v/d or pain Ext:  No inc edema from baseline  Objective:  Blood pressure 127/85, pulse 64, temperature 98.4 F (36.9 C),  temperature source Oral, resp. rate 10, height 6' (1.829 m), weight 233 lb 3.2 oz (105.8 kg), SpO2 98 %. Body mass index is 31.63 kg/m. Gen:   Well NAD, A and O *3 HEENT:    [place]/AT, EOMI,  MMM Lungs:   Normal work of breathing. CTA B/L, no Wh, rhonchi Heart:   RRR, S1, S2 WNL's, no MRG Abd:   No gross distention Exts:    warm, pink,  Brisk capillary refill, warm and well perfused.  Psych:    No HI/SI, judgement  and insight good, Euthymic mood. Full Affect.   Recent Results (from the past 2160 hour(s))  HSV(herpes smplx)abs-1+2(IgG+IgM)-bld     Status: Abnormal   Collection Time: 08/28/19 12:01 PM  Result Value Ref Range   HSVI/II Comb IgM <0.91 0.00 - 0.90 Ratio    Comment:                                  Negative        <0.91                                  Equivocal 0.91 - 1.09                                  Positive        >1.09    HSV 1 Glycoprotein G Ab, IgG 49.40 (H) 0.00 - 0.90 index    Comment:                                  Negative        <0.91                                  Equivocal 0.91 - 1.09                                  Positive        >1.09  Note: Negative indicates no antibodies detected to  HSV-1. Equivocal may suggest early infection.  If  clinically appropriate, retest at later date. Positive  indicates antibodies detected to HSV-1.    HSV 2 IgG, Type Spec <0.91 0.00 - 0.90 index    Comment:                                  Negative        <0.91                                  Equivocal 0.91 - 1.09                                  Positive        >1.09  Note: Negative indicates no antibodies detected to  HSV-2. Equivocal may suggest early infection.  If  clinically appropriate, retest at later date. Positive  indicates antibodies detected to HSV-2.   CBC with Differential/Platelet     Status: None   Collection Time: 08/28/19 12:01 PM  Result Value Ref Range   WBC 5.4 3.4 - 10.8 x10E3/uL   RBC 5.43 4.14 - 5.80 x10E6/uL   Hemoglobin 16.3 13.0  - 17.7 g/dL   Hematocrit 95.0 93.2 - 51.0 %   MCV 88 79 - 97 fL   MCH 30.0 26.6 - 33.0 pg   MCHC 34.2 31.5 - 35.7 g/dL   RDW 67.1 24.5 - 80.9 %  Platelets 245 150 - 450 x10E3/uL   Neutrophils 60 Not Estab. %   Lymphs 29 Not Estab. %   Monocytes 8 Not Estab. %   Eos 2 Not Estab. %   Basos 1 Not Estab. %   Neutrophils Absolute 3.3 1.4 - 7.0 x10E3/uL   Lymphocytes Absolute 1.5 0.7 - 3.1 x10E3/uL   Monocytes Absolute 0.4 0.1 - 0.9 x10E3/uL   EOS (ABSOLUTE) 0.1 0.0 - 0.4 x10E3/uL   Basophils Absolute 0.0 0.0 - 0.2 x10E3/uL   Immature Granulocytes 0 Not Estab. %   Immature Grans (Abs) 0.0 0.0 - 0.1 x10E3/uL  Comprehensive metabolic panel     Status: None   Collection Time: 08/28/19 12:01 PM  Result Value Ref Range   Glucose 86 65 - 99 mg/dL   BUN 7 6 - 24 mg/dL   Creatinine, Ser 0.78 0.76 - 1.27 mg/dL   GFR calc non Af Amer 106 >59 mL/min/1.73   GFR calc Af Amer 123 >59 mL/min/1.73   BUN/Creatinine Ratio 9 9 - 20   Sodium 139 134 - 144 mmol/L   Potassium 4.4 3.5 - 5.2 mmol/L   Chloride 102 96 - 106 mmol/L   CO2 22 20 - 29 mmol/L   Calcium 9.4 8.7 - 10.2 mg/dL   Total Protein 7.4 6.0 - 8.5 g/dL   Albumin 4.7 4.0 - 5.0 g/dL   Globulin, Total 2.7 1.5 - 4.5 g/dL   Albumin/Globulin Ratio 1.7 1.2 - 2.2   Bilirubin Total 0.3 0.0 - 1.2 mg/dL   Alkaline Phosphatase 73 39 - 117 IU/L   AST 23 0 - 40 IU/L   ALT 26 0 - 44 IU/L  Lipid panel     Status: Abnormal   Collection Time: 08/28/19 12:01 PM  Result Value Ref Range   Cholesterol, Total 206 (H) 100 - 199 mg/dL   Triglycerides 134 0 - 149 mg/dL   HDL 53 >39 mg/dL   VLDL Cholesterol Cal 24 5 - 40 mg/dL   LDL Chol Calc (NIH) 129 (H) 0 - 99 mg/dL   Chol/HDL Ratio 3.9 0.0 - 5.0 ratio    Comment:                                   T. Chol/HDL Ratio                                             Men  Women                               1/2 Avg.Risk  3.4    3.3                                   Avg.Risk  5.0    4.4                                 2X Avg.Risk  9.6    7.1  3X Avg.Risk 23.4   11.0   Hemoglobin A1c     Status: None   Collection Time: 08/28/19 12:01 PM  Result Value Ref Range   Hgb A1c MFr Bld 5.1 4.8 - 5.6 %    Comment:          Prediabetes: 5.7 - 6.4          Diabetes: >6.4          Glycemic control for adults with diabetes: <7.0    Est. average glucose Bld gHb Est-mCnc 100 mg/dL  T4, free     Status: None   Collection Time: 08/28/19 12:01 PM  Result Value Ref Range   Free T4 1.09 0.82 - 1.77 ng/dL  TSH     Status: None   Collection Time: 08/28/19 12:01 PM  Result Value Ref Range   TSH 2.600 0.450 - 4.500 uIU/mL  RPR     Status: None   Collection Time: 08/28/19 12:01 PM  Result Value Ref Range   RPR Ser Ql Non Reactive Non Reactive  VITAMIN D 25 Hydroxy (Vit-D Deficiency, Fractures)     Status: Abnormal   Collection Time: 08/28/19 12:01 PM  Result Value Ref Range   Vit D, 25-Hydroxy 23.7 (L) 30.0 - 100.0 ng/mL    Comment: Vitamin D deficiency has been defined by the Institute of Medicine and an Endocrine Society practice guideline as a level of serum 25-OH vitamin D less than 20 ng/mL (1,2). The Endocrine Society went on to further define vitamin D insufficiency as a level between 21 and 29 ng/mL (2). 1. IOM (Institute of Medicine). 2010. Dietary reference    intakes for calcium and D. [place]: The    [employer]. 2. Holick MF, Binkley Eagle, Bischoff-Ferrari HA, et al.    Evaluation, treatment, and prevention of vitamin D    deficiency: an Endocrine Society clinical practice    guideline. JCEM. 2011 Jul; 96(7):1911-30.   HIV Antibody (routine testing w rflx)     Status: None   Collection Time: 08/28/19 12:01 PM  Result Value Ref Range   HIV Screen 4th Generation wRfx Non Reactive Non Reactive  Hepatitis C antibody     Status: None   Collection Time: 08/28/19 12:01 PM  Result Value Ref Range   Hep C Virus Ab <0.1 0.0 - 0.9 s/co ratio     Comment:                                   Negative:     < 0.8                              Indeterminate: 0.8 - 0.9                                   Positive:     > 0.9  The CDC recommends that a positive HCV antibody result  be followed up with a HCV Nucleic Acid Amplification  test (161096).   Chlamydia/Gonococcus/Trichomonas, NAA     Status: None   Collection Time: 08/28/19 12:12 PM   Specimen: Blood   UR  Result Value Ref Range   Chlamydia by NAA Negative Negative   Gonococcus by NAA Negative Negative   Trich vag by NAA Negative Negative

## 2019-09-08 DIAGNOSIS — E785 Hyperlipidemia, unspecified: Secondary | ICD-10-CM | POA: Insufficient documentation

## 2019-10-12 DIAGNOSIS — M62838 Other muscle spasm: Secondary | ICD-10-CM | POA: Diagnosis not present

## 2019-10-12 DIAGNOSIS — M6281 Muscle weakness (generalized): Secondary | ICD-10-CM | POA: Diagnosis not present

## 2019-10-12 DIAGNOSIS — N50812 Left testicular pain: Secondary | ICD-10-CM | POA: Diagnosis not present

## 2019-10-12 DIAGNOSIS — N50811 Right testicular pain: Secondary | ICD-10-CM | POA: Diagnosis not present

## 2019-11-09 DIAGNOSIS — M6281 Muscle weakness (generalized): Secondary | ICD-10-CM | POA: Diagnosis not present

## 2019-11-09 DIAGNOSIS — R102 Pelvic and perineal pain: Secondary | ICD-10-CM | POA: Diagnosis not present

## 2019-11-09 DIAGNOSIS — N50811 Right testicular pain: Secondary | ICD-10-CM | POA: Diagnosis not present

## 2019-11-09 DIAGNOSIS — M62838 Other muscle spasm: Secondary | ICD-10-CM | POA: Diagnosis not present

## 2019-12-28 DIAGNOSIS — M25562 Pain in left knee: Secondary | ICD-10-CM | POA: Diagnosis not present

## 2019-12-28 DIAGNOSIS — M170 Bilateral primary osteoarthritis of knee: Secondary | ICD-10-CM | POA: Diagnosis not present

## 2019-12-28 DIAGNOSIS — M25561 Pain in right knee: Secondary | ICD-10-CM | POA: Diagnosis not present

## 2020-01-05 DIAGNOSIS — M25561 Pain in right knee: Secondary | ICD-10-CM | POA: Diagnosis not present

## 2020-01-05 DIAGNOSIS — M25562 Pain in left knee: Secondary | ICD-10-CM | POA: Diagnosis not present

## 2020-01-15 DIAGNOSIS — M170 Bilateral primary osteoarthritis of knee: Secondary | ICD-10-CM | POA: Diagnosis not present

## 2020-01-15 DIAGNOSIS — M25561 Pain in right knee: Secondary | ICD-10-CM | POA: Diagnosis not present

## 2020-01-15 DIAGNOSIS — M25562 Pain in left knee: Secondary | ICD-10-CM | POA: Diagnosis not present

## 2020-02-25 DIAGNOSIS — M948X6 Other specified disorders of cartilage, lower leg: Secondary | ICD-10-CM | POA: Diagnosis not present

## 2020-02-25 DIAGNOSIS — M23261 Derangement of other lateral meniscus due to old tear or injury, right knee: Secondary | ICD-10-CM | POA: Diagnosis not present

## 2020-02-25 DIAGNOSIS — S83232A Complex tear of medial meniscus, current injury, left knee, initial encounter: Secondary | ICD-10-CM | POA: Diagnosis not present

## 2020-02-25 DIAGNOSIS — S83282A Other tear of lateral meniscus, current injury, left knee, initial encounter: Secondary | ICD-10-CM | POA: Diagnosis not present

## 2020-02-25 DIAGNOSIS — M170 Bilateral primary osteoarthritis of knee: Secondary | ICD-10-CM | POA: Diagnosis not present

## 2020-02-25 DIAGNOSIS — S83231A Complex tear of medial meniscus, current injury, right knee, initial encounter: Secondary | ICD-10-CM | POA: Diagnosis not present

## 2020-02-25 DIAGNOSIS — S83242A Other tear of medial meniscus, current injury, left knee, initial encounter: Secondary | ICD-10-CM | POA: Diagnosis not present

## 2020-02-25 DIAGNOSIS — M23262 Derangement of other lateral meniscus due to old tear or injury, left knee: Secondary | ICD-10-CM | POA: Diagnosis not present

## 2020-02-25 DIAGNOSIS — X58XXXA Exposure to other specified factors, initial encounter: Secondary | ICD-10-CM | POA: Diagnosis not present

## 2020-02-25 DIAGNOSIS — Y999 Unspecified external cause status: Secondary | ICD-10-CM | POA: Diagnosis not present

## 2020-03-03 ENCOUNTER — Ambulatory Visit: Payer: BC Managed Care – PPO | Admitting: Family Medicine

## 2020-04-03 DIAGNOSIS — M170 Bilateral primary osteoarthritis of knee: Secondary | ICD-10-CM | POA: Diagnosis not present

## 2020-05-26 DIAGNOSIS — M170 Bilateral primary osteoarthritis of knee: Secondary | ICD-10-CM | POA: Diagnosis not present

## 2020-06-02 DIAGNOSIS — M170 Bilateral primary osteoarthritis of knee: Secondary | ICD-10-CM | POA: Diagnosis not present

## 2020-06-09 DIAGNOSIS — M170 Bilateral primary osteoarthritis of knee: Secondary | ICD-10-CM | POA: Diagnosis not present

## 2020-09-22 DIAGNOSIS — M170 Bilateral primary osteoarthritis of knee: Secondary | ICD-10-CM | POA: Diagnosis not present

## 2021-04-22 DIAGNOSIS — Z6833 Body mass index (BMI) 33.0-33.9, adult: Secondary | ICD-10-CM | POA: Diagnosis not present

## 2021-04-22 DIAGNOSIS — L03011 Cellulitis of right finger: Secondary | ICD-10-CM | POA: Diagnosis not present

## 2021-04-22 DIAGNOSIS — I10 Essential (primary) hypertension: Secondary | ICD-10-CM | POA: Diagnosis not present

## 2021-05-24 DIAGNOSIS — E669 Obesity, unspecified: Secondary | ICD-10-CM | POA: Diagnosis not present

## 2021-05-24 DIAGNOSIS — I10 Essential (primary) hypertension: Secondary | ICD-10-CM | POA: Diagnosis not present

## 2021-05-24 DIAGNOSIS — Z6833 Body mass index (BMI) 33.0-33.9, adult: Secondary | ICD-10-CM | POA: Diagnosis not present

## 2021-09-28 DIAGNOSIS — T1502XA Foreign body in cornea, left eye, initial encounter: Secondary | ICD-10-CM | POA: Diagnosis not present

## 2023-08-04 ENCOUNTER — Other Ambulatory Visit (INDEPENDENT_AMBULATORY_CARE_PROVIDER_SITE_OTHER): Payer: BC Managed Care – PPO

## 2023-08-04 ENCOUNTER — Encounter: Payer: Self-pay | Admitting: Orthopedic Surgery

## 2023-08-04 ENCOUNTER — Ambulatory Visit: Payer: BC Managed Care – PPO | Admitting: Orthopedic Surgery

## 2023-08-04 DIAGNOSIS — M25561 Pain in right knee: Secondary | ICD-10-CM

## 2023-08-04 DIAGNOSIS — M25562 Pain in left knee: Secondary | ICD-10-CM | POA: Diagnosis not present

## 2023-08-04 NOTE — Progress Notes (Signed)
Office Visit Note   Patient: Bradley Burns           Date of Birth: 1970/11/30           MRN: [id number] Visit Date: 08/04/2023 Requested by: No referring provider defined for this encounter. PCP: Pcp, No  Subjective: Chief Complaint  Patient presents with   Other     Bilateral knee pain    HPI: Bradley Burns is a 52 y.o. male who presents to the office reporting bilateral knee pain left worse than right most of the time but in general they are equal.  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  He reports catching and grabbing pain with difficulty weightbearing which has become worse over the past 3 weeks.  Did have bilateral knee arthroscopy in 2021 which she states did not really help very much.  At that time he was told knee replacement was in his future.  Patient has no personal or family history of DVT or pulmonary embolism.  Otherwise healthy.  Oxycodone and hydrocodone does put him in some type of panic mode.  He states he has not really tried Dilaudid or Ultram for pain medicine.  He has about 100 yards of walking endurance.  Enjoys working on cars in his spare time along with fishing and hunting.  Has 5 steps to get into his house.  Does have a wife at home..                ROS: All systems reviewed are negative as they relate to the chief complaint within the history of present illness.  Patient denies fevers or chills.  Assessment & Plan: Visit Diagnoses:  1. Pain in both knees, unspecified chronicity     Plan: Impression is end-stage arthritis with left knee being more symptomatic than right knee.  We talked about operative and nonoperative treatment options.  In general his pain is becoming severe and untenable.  We discussed simultaneous knee replacements but the Medicare data does show higher complication rate and mortality rate for patients and that cohort.  The risk and benefits of knee replacement are discussed including not  limited to infection or vessel damage incomplete pain relief as well as incomplete restoration of function.  I do think that he will improve after getting over the rigorous rehab required for knee replacement.  Anticipate about 3 months out of physical work.  Patient understands risk and benefits and wishes to proceed.  All questions answered  Follow-Up Instructions: No follow-ups on file.   Orders:  Orders Placed This Encounter  Procedures   XR KNEE 3 VIEW RIGHT   XR Knee 1-2 Views Left   No orders of the defined types were placed in this encounter.     Procedures: No procedures performed   Clinical Data: No additional findings.  Objective: Vital Signs: There were no vitals taken for this visit.  Physical Exam:  Constitutional: Patient appears well-developed HEENT:  Head: Normocephalic Eyes:EOM are normal Neck: Normal range of motion Cardiovascular: Normal rate Pulmonary/chest: Effort normal Neurologic: Patient is alert Skin: Skin is warm Psychiatric: Patient has normal mood and affect  Ortho Exam: Bilateral knee exam demonstrates mild varus alignment bilaterally.  Pedal pulses palpable.  No groin pain with internal or external rotation of either hip.  Range of motion of both knees is 0-1 15 on the right 5-1 15 on the left.  Collateral and cruciate ligaments are stable.  Small Baker's cyst  palpable in the left knee.  No effusion in either knee today.  Extensor mechanism intact and nontender and skin intact in both knee regions.  Specialty Comments:  No specialty comments available.  Imaging: XR Knee 1-2 Views Left  Result Date: 08/04/2023 AP lateral merchant radiographs left knee reviewed.  Severe tricompartmental arthritis is present worse in the medial and patellofemoral compartments.  No acute fracture.  Varus alignment present.  XR KNEE 3 VIEW RIGHT  Result Date: 08/04/2023 AP lateral merchant radiographs right knee reviewed.  End-stage tricompartmental arthritis  is present worse in the medial and patellofemoral compartments.  Slight varus alignment present.  No acute fracture.  Bone-on-bone changes noted.    PMFS History: Patient Active Problem List   Diagnosis Date Noted   Hyperlipidemia 09/08/2019   Vitamin D deficiency 06/03/2017   Hypertension 05/25/2017   Seasonal allergies 05/25/2017   History of smoking for 2-5 years 05/25/2017   Social alcohol use 05/25/2017   Obesity (BMI 30-39.9) 05/25/2017   S/P appendectomy 05/25/2017   GERD (gastroesophageal reflux disease) 05/25/2017   Past Medical History:  Diagnosis Date   Allergy    Hypertension     Family History  Problem Relation Age of Onset   Hypertension Father     Past Surgical History:  Procedure Laterality Date   ANKLE ARTHROSCOPY Bilateral    APPENDECTOMY     NASAL SEPTUM SURGERY     SHOULDER ARTHROSCOPY Left    Social History   Occupational History   Not on file  Tobacco Use   Smoking status: Never   Smokeless tobacco: Never  Vaping Use   Vaping status: Never Used  Substance and Sexual Activity   Alcohol use: Yes    Alcohol/week: 4.0 standard drinks of alcohol    Types: 4 Standard drinks or equivalent per week   Drug use: No   Sexual activity: Not Currently

## 2023-08-22 ENCOUNTER — Other Ambulatory Visit: Payer: Self-pay

## 2023-08-25 NOTE — Progress Notes (Addendum)
Surgical Instructions   Your procedure is scheduled on Tuesday September 06, 2023. Report to [facility] Main Entrance "A" at 5:30 A.M., then check in with the Admitting office. Any questions or running late day of surgery: call [phone]  Questions prior to your surgery date: call [phone], Monday-Friday, 8am-4pm. If you experience any cold or flu symptoms such as cough, fever, chills, shortness of breath, etc. between now and your scheduled surgery, please notify us at the above number.     Remember:  Do not eat after midnight the night before your surgery  You may drink clear liquids until 4:30 the morning of your surgery.   Clear liquids allowed are: Water, Non-Citrus Juices (without pulp), Carbonated Beverages, Clear Tea, Black Coffee Only (NO MILK, CREAM OR POWDERED CREAMER of any kind), and Gatorade.   Patient Instructions  The night before surgery:  No food after midnight. ONLY clear liquids after midnight  The day of surgery (if you do NOT have diabetes):  Drink ONE (1) Pre-Surgery Clear Ensure by 4:30 the morning of surgery. Drink in one sitting. Do not sip.  This drink was given to you during your hospital  pre-op appointment visit.  Nothing else to drink after completing the  Pre-Surgery Clear Ensure.         If you have questions, please contact your surgeon's office.     Take these medicines the morning of surgery with A SIP OF WATER: NONE  One week prior to surgery, STOP taking any Aspirin (unless otherwise instructed by your surgeon) Aleve, Naproxen, Ibuprofen, Motrin, Advil, Goody's, BC's, all herbal medications, fish oil, and non-prescription vitamins.                     Do NOT Smoke (Tobacco/Vaping) for 24 hours prior to your procedure.  If you use a CPAP at night, you may bring your mask/headgear for your overnight stay.   You will be asked to remove any contacts, glasses, piercing's, hearing aid's, dentures/partials prior to surgery. Please bring  cases for these items if needed.    Patients discharged the day of surgery will not be allowed to drive home, and someone needs to stay with them for 24 hours.  SURGICAL WAITING ROOM VISITATION Patients may have no more than 2 support people in the waiting area - these visitors may rotate.   Pre-op nurse will coordinate an appropriate time for 1 ADULT support person, who may not rotate, to accompany patient in pre-op.  Children under the age of 56 must have an adult with them who is not the patient and must remain in the main waiting area with an adult.  If the patient needs to stay at the hospital during part of their recovery, the visitor guidelines for inpatient rooms apply.  Please refer to the [facility] website for the visitor guidelines for any additional information.   If you received a COVID test during your pre-op visit  it is requested that you wear a mask when out in public, stay away from anyone that may not be feeling well and notify your surgeon if you develop symptoms. If you have been in contact with anyone that has tested positive in the last 10 days please notify you surgeon.      Pre-operative 5 CHG Bathing Instructions   You can play a key role in reducing the risk of infection after surgery. Your skin needs to be as free of germs as possible. You can reduce the number  of germs on your skin by washing with CHG (chlorhexidine gluconate) soap before surgery. CHG is an antiseptic soap that kills germs and continues to kill germs even after washing.   DO NOT use if you have an allergy to chlorhexidine/CHG or antibacterial soaps. If your skin becomes reddened or irritated, stop using the CHG and notify one of our RNs at [phone].   Please shower with the CHG soap starting 4 days before surgery using the following schedule:     Please keep in mind the following:  DO NOT shave, including legs and underarms, starting the day of your first shower.   You may shave your  face at any point before/day of surgery.  Place clean sheets on your bed the day you start using CHG soap. Use a clean washcloth (not used since being washed) for each shower. DO NOT sleep with pets once you start using the CHG.   CHG Shower Instructions:  Wash your face and private area with normal soap. If you choose to wash your hair, wash first with your normal shampoo.  After you use shampoo/soap, rinse your hair and body thoroughly to remove shampoo/soap residue.  Turn the water OFF and apply about 3 tablespoons (45 ml) of CHG soap to a CLEAN washcloth.  Apply CHG soap ONLY FROM YOUR NECK DOWN TO YOUR TOES (washing for 3-5 minutes)  DO NOT use CHG soap on face, private areas, open wounds, or sores.  Pay special attention to the area where your surgery is being performed.  If you are having back surgery, having someone wash your back for you may be helpful. Wait 2 minutes after CHG soap is applied, then you may rinse off the CHG soap.  Pat dry with a clean towel  Put on clean clothes/pajamas   If you choose to wear lotion, please use ONLY the CHG-compatible lotions on the back of this paper.   Additional instructions for the day of surgery: DO NOT APPLY any lotions, deodorants or cologne.   Do not bring valuables to the hospital. [facility] is not responsible for any belongings/valuables. Do not wear jewelry Put on clean/comfortable clothes.  Please brush your teeth.  Ask your nurse before applying any prescription medications to the skin.     CHG Compatible Lotions   Aveeno Moisturizing lotion  Cetaphil Moisturizing Cream  Cetaphil Moisturizing Lotion  Clairol Herbal Essence Moisturizing Lotion, Dry Skin  Clairol Herbal Essence Moisturizing Lotion, Extra Dry Skin  Clairol Herbal Essence Moisturizing Lotion, Normal Skin  Curel Age Defying Therapeutic Moisturizing Lotion with Alpha Hydroxy  Curel Extreme Care Body Lotion  Curel Soothing Hands Moisturizing Hand Lotion   Curel Therapeutic Moisturizing Cream, Fragrance-Free  Curel Therapeutic Moisturizing Lotion, Fragrance-Free  Curel Therapeutic Moisturizing Lotion, Original Formula  Eucerin Daily Replenishing Lotion  Eucerin Dry Skin Therapy Plus Alpha Hydroxy Crme  Eucerin Dry Skin Therapy Plus Alpha Hydroxy Lotion  Eucerin Original Crme  Eucerin Original Lotion  Eucerin Plus Crme Eucerin Plus Lotion  Eucerin TriLipid Replenishing Lotion  Keri Anti-Bacterial Hand Lotion  Keri Deep Conditioning Original Lotion Dry Skin Formula Softly Scented  Keri Deep Conditioning Original Lotion, Fragrance Free Sensitive Skin Formula  Keri Lotion Fast Absorbing Fragrance Free Sensitive Skin Formula  Keri Lotion Fast Absorbing Softly Scented Dry Skin Formula  Keri Original Lotion  Keri Skin Renewal Lotion Keri Silky Smooth Lotion  Keri Silky Smooth Sensitive Skin Lotion  Nivea Body Creamy Conditioning Oil  Nivea Body Extra Enriched Education administrator Original  Lotion  Nivea Body Sheer Moisturizing Lotion Nivea Crme  Nivea Skin Firming Lotion  NutraDerm 30 Skin Lotion  NutraDerm Skin Lotion  NutraDerm Therapeutic Skin Cream  NutraDerm Therapeutic Skin Lotion  ProShield Protective Hand Cream  Provon moisturizing lotion  Please read over the following fact sheets that you were given.

## 2023-08-26 ENCOUNTER — Encounter (HOSPITAL_COMMUNITY)
Admission: RE | Admit: 2023-08-26 | Discharge: 2023-08-26 | Disposition: A | Discharge status: Home / Self Care | Payer: BC Managed Care – PPO | Source: Ambulatory Visit | Attending: Orthopedic Surgery | Admitting: Orthopedic Surgery

## 2023-08-26 ENCOUNTER — Other Ambulatory Visit: Payer: Self-pay

## 2023-08-26 ENCOUNTER — Encounter (HOSPITAL_COMMUNITY): Payer: Self-pay

## 2023-08-26 VITALS — BP 165/104 | HR 65 | Temp 98.4°F | Resp 18 | Ht 71.0 in | Wt 258.8 lb

## 2023-08-26 DIAGNOSIS — Z01818 Encounter for other preprocedural examination: Secondary | ICD-10-CM | POA: Insufficient documentation

## 2023-08-26 DIAGNOSIS — I10 Essential (primary) hypertension: Secondary | ICD-10-CM | POA: Insufficient documentation

## 2023-08-26 LAB — BASIC METABOLIC PANEL
Anion gap: 9 (ref 5–15)
BUN: 12 mg/dL (ref 6–20)
CO2: 25 mmol/L (ref 22–32)
Calcium: 9.1 mg/dL (ref 8.9–10.3)
Chloride: 103 mmol/L (ref 98–111)
Creatinine, Ser: 0.79 mg/dL (ref 0.61–1.24)
GFR, Estimated: >60 mL/min (ref >60)
Glucose, Bld: 96 mg/dL (ref 70–99)
Potassium: 4 mmol/L (ref 3.5–5.1)
Sodium: 137 mmol/L (ref 135–145)

## 2023-08-26 LAB — URINALYSIS, W/ REFLEX TO CULTURE (INFECTION SUSPECTED)
Bacteria, UA: NONE SEEN
Bilirubin Urine: NEGATIVE
Glucose, UA: NEGATIVE mg/dL
Hgb urine dipstick: NEGATIVE
Ketones, ur: NEGATIVE mg/dL
Leukocytes,Ua: NEGATIVE
Nitrite: NEGATIVE
Protein, ur: NEGATIVE mg/dL
Specific Gravity, Urine: 1.024 (ref 1.005–1.030)
pH: 7 (ref 5.0–8.0)

## 2023-08-26 LAB — CBC
HCT: 47.3 % (ref 39.0–52.0)
Hemoglobin: 16.1 g/dL (ref 13.0–17.0)
MCH: 30.4 pg (ref 26.0–34.0)
MCHC: 34 g/dL (ref 30.0–36.0)
MCV: 89.2 fL (ref 80.0–100.0)
Platelets: 229 10*3/uL (ref 150–400)
RBC: 5.3 MIL/uL (ref 4.22–5.81)
RDW: 12.2 % (ref 11.5–15.5)
WBC: 5.6 10*3/uL (ref 4.0–10.5)
nRBC: 0 % (ref 0.0–0.2)

## 2023-08-26 LAB — SURGICAL PCR SCREEN
MRSA, PCR: NEGATIVE
Staphylococcus aureus: NEGATIVE

## 2023-08-26 NOTE — Progress Notes (Signed)
PCP - Warnell Forester Cardiologist - denies  PPM/ICD - denies   Chest x-ray - n/a EKG - 08/26/23 Stress Test - denies ECHO - denies Cardiac Cath - denies  Sleep Study - denies but has risk factors for sleep apnea. Instructed him to let his pcp know.   7 days prior to surgery STOP taking any Aspirin (unless otherwise instructed by your surgeon), Aleve, Naproxen, Ibuprofen, Motrin, Advil, Goody's, BC's, all herbal medications, fish oil, and all vitamins.   ERAS Protcol -yes PRE-SURGERY Ensure or G2- ensure ordered and given  COVID TEST- not needed   Anesthesia review: yes, BP 160/100 during PAT appt. Has losartan ordered but has not been taking it. Encouraged patient to start taking losartan today and make an appt with pcp to follow up on future BP readings. Bradley Burns. Notified.   Patient denies shortness of breath, fever, cough and chest pain at PAT appointment   All instructions explained to the patient, with a verbal understanding of the material. Patient agrees to go over the instructions while at home for a better understanding. Patient also instructed to self quarantine after being tested for COVID-19. The opportunity to ask questions was provided.

## 2023-09-05 MED ORDER — TRANEXAMIC ACID 1000 MG/10ML IV SOLN
2000.0000 mg | INTRAVENOUS | Status: DC
  Filled 2023-09-05: qty 20

## 2023-09-05 NOTE — Anesthesia Preprocedure Evaluation (Signed)
Anesthesia Evaluation  Patient identified by MRN, date of birth, ID band Patient awake    Reviewed: Allergy & Precautions, NPO status , Patient's Chart, lab work & pertinent test results  History of Anesthesia Complications Negative for: history of anesthetic complications  Airway Mallampati: II  TM Distance: >3 FB Neck ROM: Full    Dental  (+) Dental Advisory Given   Pulmonary neg pulmonary ROS   breath sounds clear to auscultation       Cardiovascular hypertension, Pt. on medications (-) angina  Rhythm:Regular Rate:Normal     Neuro/Psych    GI/Hepatic Neg liver ROS,GERD  Controlled,,  Endo/Other  BMI 35  Renal/GU negative Renal ROS     Musculoskeletal   Abdominal   Peds  Hematology negative hematology ROS (+)   Anesthesia Other Findings   Reproductive/Obstetrics                             Anesthesia Physical Anesthesia Plan  ASA: 2  Anesthesia Plan: Spinal   Post-op Pain Management: Regional block* and Tylenol PO (pre-op)*   Induction:   PONV Risk Score and Plan: 1 and Ondansetron and Treatment may vary due to age or medical condition  Airway Management Planned: Natural Airway and Simple Face Mask  Additional Equipment: None  Intra-op Plan:   Post-operative Plan:   Informed Consent: I have reviewed the patients History and Physical, chart, labs and discussed the procedure including the risks, benefits and alternatives for the proposed anesthesia with the patient or authorized representative who has indicated his/her understanding and acceptance.     Dental advisory given  Plan Discussed with: CRNA and Surgeon  Anesthesia Plan Comments: (Plan routine monitors, SAB with adductor canal block for post op anallgesia)        Anesthesia Quick Evaluation

## 2023-09-06 ENCOUNTER — Ambulatory Visit (HOSPITAL_COMMUNITY): Payer: BC Managed Care – PPO | Admitting: Vascular Surgery

## 2023-09-06 ENCOUNTER — Other Ambulatory Visit: Payer: Self-pay

## 2023-09-06 ENCOUNTER — Encounter (HOSPITAL_COMMUNITY)
Admission: RE | Disposition: A | Discharge status: Home / Self Care | Payer: Self-pay | Source: Ambulatory Visit | Attending: Orthopedic Surgery

## 2023-09-06 ENCOUNTER — Encounter (HOSPITAL_COMMUNITY): Payer: Self-pay | Admitting: Orthopedic Surgery

## 2023-09-06 ENCOUNTER — Other Ambulatory Visit: Payer: Self-pay | Admitting: Orthopedic Surgery

## 2023-09-06 ENCOUNTER — Ambulatory Visit (HOSPITAL_COMMUNITY): Payer: Self-pay

## 2023-09-06 ENCOUNTER — Observation Stay (HOSPITAL_COMMUNITY)
Admission: RE | Admit: 2023-09-06 | Discharge: 2023-09-07 | Disposition: A | Discharge status: Home / Self Care | Payer: BC Managed Care – PPO | Source: Ambulatory Visit | Attending: Orthopedic Surgery | Admitting: Orthopedic Surgery

## 2023-09-06 DIAGNOSIS — M1712 Unilateral primary osteoarthritis, left knee: Principal | ICD-10-CM | POA: Insufficient documentation

## 2023-09-06 DIAGNOSIS — I10 Essential (primary) hypertension: Secondary | ICD-10-CM | POA: Diagnosis not present

## 2023-09-06 DIAGNOSIS — M179 Osteoarthritis of knee, unspecified: Secondary | ICD-10-CM | POA: Diagnosis present

## 2023-09-06 DIAGNOSIS — M1710 Unilateral primary osteoarthritis, unspecified knee: Secondary | ICD-10-CM | POA: Diagnosis present

## 2023-09-06 DIAGNOSIS — Z01818 Encounter for other preprocedural examination: Principal | ICD-10-CM

## 2023-09-06 HISTORY — PX: TOTAL KNEE ARTHROPLASTY: SHX125

## 2023-09-06 SURGERY — ARTHROPLASTY, KNEE, TOTAL
Anesthesia: Spinal | Site: Knee | Laterality: Left

## 2023-09-06 MED ORDER — EPHEDRINE 5 MG/ML INJ
INTRAVENOUS | Status: AC
  Filled 2023-09-06: qty 5

## 2023-09-06 MED ORDER — FENTANYL CITRATE (PF) 250 MCG/5ML IJ SOLN
INTRAMUSCULAR | Status: AC
  Filled 2023-09-06: qty 5

## 2023-09-06 MED ORDER — ONDANSETRON HCL 4 MG/2ML IJ SOLN
INTRAMUSCULAR | Status: AC
Start: 2023-09-06 — End: ?
  Filled 2023-09-06: qty 2

## 2023-09-06 MED ORDER — TRAMADOL HCL 50 MG PO TABS: 50.0000 mg | ORAL_TABLET | Freq: Four times a day (QID) | ORAL | 0 refills | Status: DC

## 2023-09-06 MED ORDER — MORPHINE SULFATE (PF) 4 MG/ML IV SOLN
INTRAVENOUS | Status: AC
  Filled 2023-09-06: qty 2

## 2023-09-06 MED ORDER — MIDAZOLAM HCL 2 MG/2ML IJ SOLN
INTRAMUSCULAR | Status: AC
  Filled 2023-09-06: qty 2

## 2023-09-06 MED ORDER — METOCLOPRAMIDE HCL 5 MG/ML IJ SOLN: 5.0000 mg | Freq: Three times a day (TID) | INTRAMUSCULAR | Status: DC | PRN

## 2023-09-06 MED ORDER — OXYCODONE HCL 5 MG PO TABS
ORAL_TABLET | ORAL | Status: AC
  Filled 2023-09-06: qty 1

## 2023-09-06 MED ORDER — CHLORHEXIDINE GLUCONATE 0.12 % MT SOLN
15.0000 mL | Freq: Once | OROMUCOSAL | Status: AC
  Administered 2023-09-06: 15 mL via OROMUCOSAL
  Filled 2023-09-06: qty 15

## 2023-09-06 MED ORDER — SODIUM CHLORIDE 0.9% FLUSH
INTRAVENOUS | Status: DC | PRN
  Administered 2023-09-06: 20 mL

## 2023-09-06 MED ORDER — ROPIVACAINE HCL 7.5 MG/ML IJ SOLN
INTRAMUSCULAR | Status: DC | PRN
  Administered 2023-09-06: 20 mL via PERINEURAL

## 2023-09-06 MED ORDER — PROPOFOL 10 MG/ML IV BOLUS
INTRAVENOUS | Status: AC
  Filled 2023-09-06: qty 20

## 2023-09-06 MED ORDER — OXYCODONE HCL 5 MG/5ML PO SOLN: 5.0000 mg | Freq: Once | ORAL | Status: AC | PRN

## 2023-09-06 MED ORDER — MIDAZOLAM HCL 2 MG/2ML IJ SOLN
0.5000 mg | Freq: Once | INTRAMUSCULAR | Status: DC | PRN
Start: 2023-09-06 — End: 2023-09-06

## 2023-09-06 MED ORDER — LOSARTAN POTASSIUM 50 MG PO TABS
50.0000 mg | ORAL_TABLET | Freq: Every day | ORAL | Status: DC
  Administered 2023-09-06 – 2023-09-07 (×2): 50 mg via ORAL
  Filled 2023-09-06 (×2): qty 1

## 2023-09-06 MED ORDER — ACETAMINOPHEN 500 MG PO TABS
1000.0000 mg | ORAL_TABLET | Freq: Once | ORAL | Status: AC
  Administered 2023-09-06: 1000 mg via ORAL
  Filled 2023-09-06: qty 2

## 2023-09-06 MED ORDER — OXYCODONE HCL 5 MG PO TABS
5.0000 mg | ORAL_TABLET | ORAL | Status: DC | PRN
  Administered 2023-09-06 – 2023-09-07 (×3): 5 mg via ORAL
  Filled 2023-09-06 (×4): qty 1

## 2023-09-06 MED ORDER — MENTHOL 3 MG MT LOZG
1.0000 | LOZENGE | OROMUCOSAL | Status: DC | PRN
Start: 2023-09-06 — End: 2023-09-07

## 2023-09-06 MED ORDER — SODIUM CHLORIDE 0.9 % IR SOLN
Status: DC | PRN
  Administered 2023-09-06: 3000 mL

## 2023-09-06 MED ORDER — FENTANYL CITRATE (PF) 250 MCG/5ML IJ SOLN
INTRAMUSCULAR | Status: DC | PRN
  Administered 2023-09-06: 100 ug via INTRAVENOUS

## 2023-09-06 MED ORDER — PROPOFOL 10 MG/ML IV BOLUS
INTRAVENOUS | Status: DC | PRN
  Administered 2023-09-06: 20 mg via INTRAVENOUS
  Administered 2023-09-06: 30 mg via INTRAVENOUS
  Administered 2023-09-06: 100 ug/kg/min via INTRAVENOUS
  Administered 2023-09-06: 30 mg via INTRAVENOUS
  Administered 2023-09-06: 80 ug/kg/min via INTRAVENOUS

## 2023-09-06 MED ORDER — CLONIDINE HCL (ANALGESIA) 100 MCG/ML EP SOLN
EPIDURAL | Status: DC | PRN
  Administered 2023-09-06: 1 mL

## 2023-09-06 MED ORDER — HYDROMORPHONE HCL 1 MG/ML IJ SOLN
0.2500 mg | INTRAMUSCULAR | Status: DC | PRN
  Administered 2023-09-06 (×2): 0.5 mg via INTRAVENOUS

## 2023-09-06 MED ORDER — ACETAMINOPHEN 325 MG PO TABS: 325.0000 mg | ORAL_TABLET | Freq: Four times a day (QID) | ORAL | Status: DC | PRN

## 2023-09-06 MED ORDER — MORPHINE SULFATE (PF) 4 MG/ML IV SOLN
INTRAVENOUS | Status: DC | PRN
  Administered 2023-09-06: 8 mg

## 2023-09-06 MED ORDER — BUPIVACAINE HCL 0.25 % IJ SOLN
INTRAMUSCULAR | Status: DC | PRN
  Administered 2023-09-06: 30 mL

## 2023-09-06 MED ORDER — PHENYLEPHRINE HCL-NACL 20-0.9 MG/250ML-% IV SOLN
INTRAVENOUS | Status: DC | PRN
  Administered 2023-09-06: 20 ug/min via INTRAVENOUS

## 2023-09-06 MED ORDER — 0.9 % SODIUM CHLORIDE (POUR BTL) OPTIME
TOPICAL | Status: DC | PRN
  Administered 2023-09-06: 1000 mL

## 2023-09-06 MED ORDER — TRANEXAMIC ACID 1000 MG/10ML IV SOLN
INTRAVENOUS | Status: DC | PRN
  Administered 2023-09-06: 2000 mg via TOPICAL

## 2023-09-06 MED ORDER — DOCUSATE SODIUM 100 MG PO CAPS: 100.0000 mg | ORAL_CAPSULE | Freq: Two times a day (BID) | ORAL | 0 refills | Status: AC

## 2023-09-06 MED ORDER — LACTATED RINGERS IV SOLN: INTRAVENOUS | Status: DC

## 2023-09-06 MED ORDER — ACETAMINOPHEN 325 MG PO TABS: 325.0000 mg | ORAL_TABLET | Freq: Four times a day (QID) | ORAL | 0 refills | Status: AC | PRN

## 2023-09-06 MED ORDER — GABAPENTIN 100 MG PO CAPS
200.0000 mg | ORAL_CAPSULE | Freq: Three times a day (TID) | ORAL | Status: DC
Start: 2023-09-06 — End: 2023-09-07
  Administered 2023-09-06 – 2023-09-07 (×4): 200 mg via ORAL
  Filled 2023-09-06 (×4): qty 2

## 2023-09-06 MED ORDER — METHOCARBAMOL 1000 MG/10ML IJ SOLN
500.0000 mg | Freq: Four times a day (QID) | INTRAMUSCULAR | Status: DC | PRN
  Administered 2023-09-06: 500 mg via INTRAVENOUS
  Filled 2023-09-06: qty 10

## 2023-09-06 MED ORDER — HYDROMORPHONE HCL 1 MG/ML IJ SOLN
INTRAMUSCULAR | Status: AC
  Filled 2023-09-06: qty 1

## 2023-09-06 MED ORDER — POVIDONE-IODINE 10 % EX SWAB
2.0000 | Freq: Once | CUTANEOUS | Status: AC
  Administered 2023-09-06: 2 via TOPICAL

## 2023-09-06 MED ORDER — SENNOSIDES-DOCUSATE SODIUM 8.6-50 MG PO TABS
1.0000 | ORAL_TABLET | Freq: Every evening | ORAL | Status: DC | PRN
Start: 2023-09-06 — End: 2023-09-07

## 2023-09-06 MED ORDER — BUPIVACAINE LIPOSOME 1.3 % IJ SUSP
INTRAMUSCULAR | Status: DC | PRN
  Administered 2023-09-06: 20 mL

## 2023-09-06 MED ORDER — TRAMADOL HCL 50 MG PO TABS
50.0000 mg | ORAL_TABLET | Freq: Four times a day (QID) | ORAL | Status: DC
Start: 2023-09-06 — End: 2023-09-07
  Administered 2023-09-06 – 2023-09-07 (×3): 50 mg via ORAL
  Filled 2023-09-06 (×4): qty 1

## 2023-09-06 MED ORDER — MEPERIDINE HCL 25 MG/ML IJ SOLN: 6.2500 mg | INTRAMUSCULAR | Status: DC | PRN

## 2023-09-06 MED ORDER — SODIUM CHLORIDE 0.9 % IV SOLN: INTRAVENOUS | Status: DC | PRN

## 2023-09-06 MED ORDER — BUPIVACAINE LIPOSOME 1.3 % IJ SUSP
INTRAMUSCULAR | Status: AC
  Filled 2023-09-06: qty 20

## 2023-09-06 MED ORDER — ONDANSETRON HCL 4 MG/2ML IJ SOLN: 4.0000 mg | Freq: Four times a day (QID) | INTRAMUSCULAR | Status: DC | PRN

## 2023-09-06 MED ORDER — POVIDONE-IODINE 7.5 % EX SOLN: Freq: Once | CUTANEOUS | Status: DC

## 2023-09-06 MED ORDER — POVIDONE-IODINE 10 % EX SWAB: 2.0000 | Freq: Once | CUTANEOUS | Status: DC

## 2023-09-06 MED ORDER — BUPIVACAINE HCL (PF) 0.25 % IJ SOLN
INTRAMUSCULAR | Status: AC
  Filled 2023-09-06: qty 30

## 2023-09-06 MED ORDER — ONDANSETRON HCL 4 MG PO TABS: 4.0000 mg | ORAL_TABLET | Freq: Four times a day (QID) | ORAL | Status: DC | PRN

## 2023-09-06 MED ORDER — PHENOL 1.4 % MT LIQD: 1.0000 | OROMUCOSAL | Status: DC | PRN

## 2023-09-06 MED ORDER — ASPIRIN 81 MG PO CHEW: 81.0000 mg | CHEWABLE_TABLET | Freq: Two times a day (BID) | ORAL | 0 refills | Status: AC

## 2023-09-06 MED ORDER — BUPIVACAINE IN DEXTROSE 0.75-8.25 % IT SOLN
INTRATHECAL | Status: DC | PRN
  Administered 2023-09-06: 15 mg via INTRATHECAL

## 2023-09-06 MED ORDER — DOCUSATE SODIUM 100 MG PO CAPS
100.0000 mg | ORAL_CAPSULE | Freq: Two times a day (BID) | ORAL | Status: DC
  Administered 2023-09-06 – 2023-09-07 (×3): 100 mg via ORAL
  Filled 2023-09-06 (×3): qty 1

## 2023-09-06 MED ORDER — PHENYLEPHRINE 80 MCG/ML (10ML) SYRINGE FOR IV PUSH (FOR BLOOD PRESSURE SUPPORT)
PREFILLED_SYRINGE | INTRAVENOUS | Status: AC
  Filled 2023-09-06: qty 10

## 2023-09-06 MED ORDER — TRANEXAMIC ACID-NACL 1000-0.7 MG/100ML-% IV SOLN
1000.0000 mg | INTRAVENOUS | Status: AC
  Administered 2023-09-06: 1000 mg via INTRAVENOUS
  Filled 2023-09-06: qty 100

## 2023-09-06 MED ORDER — CELECOXIB 200 MG PO CAPS
200.0000 mg | ORAL_CAPSULE | Freq: Two times a day (BID) | ORAL | Status: DC
  Administered 2023-09-06 – 2023-09-07 (×3): 200 mg via ORAL
  Filled 2023-09-06 (×3): qty 1

## 2023-09-06 MED ORDER — CEFAZOLIN SODIUM-DEXTROSE 1-4 GM/50ML-% IV SOLN
1.0000 g | Freq: Four times a day (QID) | INTRAVENOUS | Status: AC
  Administered 2023-09-06 (×2): 1 g via INTRAVENOUS
  Filled 2023-09-06 (×2): qty 50

## 2023-09-06 MED ORDER — OXYCODONE HCL 5 MG PO TABS: 5.0000 mg | ORAL_TABLET | ORAL | 0 refills | Status: DC | PRN

## 2023-09-06 MED ORDER — POTASSIUM CHLORIDE IN NACL 20-0.9 MEQ/L-% IV SOLN
INTRAVENOUS | Status: AC
  Filled 2023-09-06: qty 1000

## 2023-09-06 MED ORDER — VANCOMYCIN HCL 1000 MG IV SOLR
INTRAVENOUS | Status: AC
  Filled 2023-09-06: qty 20

## 2023-09-06 MED ORDER — ORAL CARE MOUTH RINSE: 15.0000 mL | Freq: Once | OROMUCOSAL | Status: AC

## 2023-09-06 MED ORDER — PHENYLEPHRINE 80 MCG/ML (10ML) SYRINGE FOR IV PUSH (FOR BLOOD PRESSURE SUPPORT)
PREFILLED_SYRINGE | INTRAVENOUS | Status: DC | PRN
  Administered 2023-09-06 (×2): 120 ug via INTRAVENOUS
  Administered 2023-09-06: 160 ug via INTRAVENOUS
  Administered 2023-09-06: 120 ug via INTRAVENOUS
  Administered 2023-09-06: 160 ug via INTRAVENOUS
  Administered 2023-09-06: 120 ug via INTRAVENOUS
  Administered 2023-09-06 (×2): 200 ug via INTRAVENOUS

## 2023-09-06 MED ORDER — HYDROMORPHONE HCL 1 MG/ML IJ SOLN
0.5000 mg | INTRAMUSCULAR | Status: DC | PRN
  Administered 2023-09-06 – 2023-09-07 (×4): 1 mg via INTRAVENOUS
  Filled 2023-09-06 (×4): qty 1

## 2023-09-06 MED ORDER — GABAPENTIN 100 MG PO CAPS: 200.0000 mg | ORAL_CAPSULE | Freq: Three times a day (TID) | ORAL | 0 refills | Status: DC

## 2023-09-06 MED ORDER — METHOCARBAMOL 500 MG PO TABS: 500.0000 mg | ORAL_TABLET | Freq: Four times a day (QID) | ORAL | 0 refills | Status: DC | PRN

## 2023-09-06 MED ORDER — OXYCODONE HCL 5 MG PO TABS
5.0000 mg | ORAL_TABLET | Freq: Once | ORAL | Status: AC | PRN
  Administered 2023-09-06: 5 mg via ORAL

## 2023-09-06 MED ORDER — VANCOMYCIN HCL 1000 MG IV SOLR
INTRAVENOUS | Status: DC | PRN
  Administered 2023-09-06: 1000 mg via TOPICAL

## 2023-09-06 MED ORDER — MIDAZOLAM HCL 2 MG/2ML IJ SOLN
INTRAMUSCULAR | Status: DC | PRN
  Administered 2023-09-06: 2 mg via INTRAVENOUS

## 2023-09-06 MED ORDER — CLONIDINE HCL (ANALGESIA) 100 MCG/ML EP SOLN
EPIDURAL | Status: AC
Start: 2023-09-06 — End: ?
  Filled 2023-09-06: qty 10

## 2023-09-06 MED ORDER — EPHEDRINE SULFATE-NACL 50-0.9 MG/10ML-% IV SOSY
PREFILLED_SYRINGE | INTRAVENOUS | Status: DC | PRN
  Administered 2023-09-06: 5 mg via INTRAVENOUS
  Administered 2023-09-06: 2.5 mg via INTRAVENOUS
  Administered 2023-09-06 (×2): 5 mg via INTRAVENOUS
  Administered 2023-09-06: 2.5 mg via INTRAVENOUS
  Administered 2023-09-06: 5 mg via INTRAVENOUS

## 2023-09-06 MED ORDER — ASPIRIN 81 MG PO CHEW
81.0000 mg | CHEWABLE_TABLET | Freq: Two times a day (BID) | ORAL | Status: DC
  Administered 2023-09-06 – 2023-09-07 (×2): 81 mg via ORAL
  Filled 2023-09-06 (×2): qty 1

## 2023-09-06 MED ORDER — HYDROMORPHONE HCL 2 MG PO TABS: 2.0000 mg | ORAL_TABLET | Freq: Three times a day (TID) | ORAL | 0 refills | Status: DC | PRN

## 2023-09-06 MED ORDER — METHOCARBAMOL 500 MG PO TABS
500.0000 mg | ORAL_TABLET | Freq: Four times a day (QID) | ORAL | Status: DC | PRN
  Administered 2023-09-07 (×2): 500 mg via ORAL
  Filled 2023-09-06 (×2): qty 1

## 2023-09-06 MED ORDER — CELECOXIB 200 MG PO CAPS: 200.0000 mg | ORAL_CAPSULE | Freq: Every day | ORAL | 0 refills | Status: AC

## 2023-09-06 MED ORDER — IRRISEPT - 450ML BOTTLE WITH 0.05% CHG IN STERILE WATER, USP 99.95% OPTIME
TOPICAL | Status: DC | PRN
  Administered 2023-09-06: 450 mL via TOPICAL

## 2023-09-06 MED ORDER — ONDANSETRON HCL 4 MG/2ML IJ SOLN
INTRAMUSCULAR | Status: DC | PRN
  Administered 2023-09-06: 4 mg via INTRAVENOUS

## 2023-09-06 MED ORDER — METOCLOPRAMIDE HCL 5 MG PO TABS: 5.0000 mg | ORAL_TABLET | Freq: Three times a day (TID) | ORAL | Status: DC | PRN

## 2023-09-06 MED ORDER — CEFAZOLIN SODIUM-DEXTROSE 2-4 GM/100ML-% IV SOLN
2.0000 g | INTRAVENOUS | Status: AC
  Administered 2023-09-06: 2 g via INTRAVENOUS
  Filled 2023-09-06: qty 100

## 2023-09-06 SURGICAL SUPPLY — 85 items
BAG COUNTER SPONGE SURGICOUNT (BAG) ×2 IMPLANT
BAG DECANTER FOR FLEXI CONT (MISCELLANEOUS) ×2 IMPLANT
BAG SPNG CNTER NS LX DISP (BAG) ×1
BANDAGE ESMARK 6X9 LF (GAUZE/BANDAGES/DRESSINGS) ×2 IMPLANT
BIT DRILL QUICK REL 1/8 2PK SL (BIT) IMPLANT
BLADE SAG 18X100X1.27 (BLADE) ×2 IMPLANT
BLADE SAGITTAL (BLADE) ×1
BLADE SAW THK.89X75X18XSGTL (BLADE) ×2 IMPLANT
BNDG CMPR 5X6 CHSV STRCH STRL (GAUZE/BANDAGES/DRESSINGS) ×1
BNDG CMPR 9X6 STRL LF SNTH (GAUZE/BANDAGES/DRESSINGS) ×1
BNDG CMPR MED 15X6 ELC VLCR LF (GAUZE/BANDAGES/DRESSINGS) ×1
BNDG COHESIVE 6X5 TAN ST LF (GAUZE/BANDAGES/DRESSINGS) ×2 IMPLANT
BNDG ELASTIC 6X15 VLCR STRL LF (GAUZE/BANDAGES/DRESSINGS) ×2 IMPLANT
BNDG ESMARK 6X9 LF (GAUZE/BANDAGES/DRESSINGS) ×1
BOWL SMART MIX CTS (DISPOSABLE) IMPLANT
CNTNR URN SCR LID CUP LEK RST (MISCELLANEOUS) ×2 IMPLANT
COMP FEM PS KNEE STD 10 LT (Knees) ×1 IMPLANT
COMP PATELLA PEG 3 32 (Joint) ×1 IMPLANT
COMP TIB PS G 0D LT (Joint) ×1 IMPLANT
COMPONENT FEM PS KN STD 10 LT (Knees) IMPLANT
COMPONENT PATELLA PEG 3 32 (Joint) IMPLANT
COMPONET TIB PS G 0D LT (Joint) IMPLANT
CONT SPEC 4OZ STRL OR WHT (MISCELLANEOUS) ×1
COVER SURGICAL LIGHT HANDLE (MISCELLANEOUS) ×2 IMPLANT
CUFF TOURN SGL QUICK 34 (TOURNIQUET CUFF) ×1
CUFF TOURN SGL QUICK 42 (TOURNIQUET CUFF) IMPLANT
CUFF TRNQT CYL 34X4.125X (TOURNIQUET CUFF) ×2 IMPLANT
DRAPE INCISE IOBAN 66X45 STRL (DRAPES) IMPLANT
DRAPE SURG ORHT 6 SPLT 77X108 (DRAPES) ×6 IMPLANT
DRAPE U-SHAPE 47X51 STRL (DRAPES) ×2 IMPLANT
DRSG AQUACEL AG ADV 3.5X14 (GAUZE/BANDAGES/DRESSINGS) IMPLANT
DURAPREP 26ML APPLICATOR (WOUND CARE) ×4 IMPLANT
ELECT CAUTERY BLADE 6.4 (BLADE) ×2 IMPLANT
ELECT REM PT RETURN 9FT ADLT (ELECTROSURGICAL) ×1
ELECTRODE REM PT RTRN 9FT ADLT (ELECTROSURGICAL) ×2 IMPLANT
GAUZE SPONGE 4X4 12PLY STRL (GAUZE/BANDAGES/DRESSINGS) ×2 IMPLANT
GLOVE BIOGEL PI IND STRL 7.0 (GLOVE) ×2 IMPLANT
GLOVE BIOGEL PI IND STRL 8 (GLOVE) ×2 IMPLANT
GLOVE ECLIPSE 7.0 STRL STRAW (GLOVE) ×2 IMPLANT
GLOVE ECLIPSE 8.0 STRL XLNG CF (GLOVE) ×2 IMPLANT
GLOVE SURG ENC MOIS LTX SZ6.5 (GLOVE) ×6 IMPLANT
GLOVE SURG SS PI 6.5 STRL IVOR (GLOVE) IMPLANT
GOWN STRL REUS W/ TWL LRG LVL3 (GOWN DISPOSABLE) ×6 IMPLANT
GOWN STRL REUS W/TWL LRG LVL3 (GOWN DISPOSABLE) ×3
HANDPIECE INTERPULSE COAX TIP (DISPOSABLE) ×1
HOOD PEEL AWAY T7 (MISCELLANEOUS) ×6 IMPLANT
IMMOBILIZER KNEE 20 (SOFTGOODS) IMPLANT
IMMOBILIZER KNEE 20 THIGH 36 (SOFTGOODS) IMPLANT
IMMOBILIZER KNEE 22 UNIV (SOFTGOODS) IMPLANT
IMMOBILIZER KNEE 24 THIGH 36 (MISCELLANEOUS) IMPLANT
IMMOBILIZER KNEE 24 UNIV (MISCELLANEOUS) IMPLANT
INSERT ARTISURF S8-11 18X22X14 (Insert) IMPLANT
KIT BASIN OR (CUSTOM PROCEDURE TRAY) ×2 IMPLANT
KIT TURNOVER KIT B (KITS) ×2 IMPLANT
MANIFOLD NEPTUNE II (INSTRUMENTS) ×2 IMPLANT
NDL 22X1.5 STRL (OR ONLY) (MISCELLANEOUS) ×4 IMPLANT
NDL SPNL 18GX3.5 QUINCKE PK (NEEDLE) ×2 IMPLANT
NEEDLE 22X1.5 STRL (OR ONLY) (MISCELLANEOUS) ×2 IMPLANT
NEEDLE SPNL 18GX3.5 QUINCKE PK (NEEDLE) ×1 IMPLANT
NS IRRIG 1000ML POUR BTL (IV SOLUTION) ×4 IMPLANT
PACK TOTAL JOINT (CUSTOM PROCEDURE TRAY) ×2 IMPLANT
PAD ARMBOARD 7.5X6 YLW CONV (MISCELLANEOUS) ×4 IMPLANT
PAD CAST 4YDX4 CTTN HI CHSV (CAST SUPPLIES) ×2 IMPLANT
PADDING CAST COTTON 4X4 STRL (CAST SUPPLIES)
PADDING CAST COTTON 6X4 STRL (CAST SUPPLIES) ×2 IMPLANT
SCREW FEMALE HEX FIX 25X2.5 (ORTHOPEDIC DISPOSABLE SUPPLIES) IMPLANT
SET HNDPC FAN SPRY TIP SCT (DISPOSABLE) ×2 IMPLANT
SPIKE FLUID TRANSFER (MISCELLANEOUS) ×2 IMPLANT
STRIP CLOSURE SKIN 1/2X4 (GAUZE/BANDAGES/DRESSINGS) ×4 IMPLANT
SUCTION TUBE FRAZIER 10FR DISP (SUCTIONS) ×2 IMPLANT
SUT MNCRL AB 3-0 PS2 18 (SUTURE) ×2 IMPLANT
SUT MNCRL AB 3-0 PS2 27 (SUTURE) IMPLANT
SUT VIC AB 0 CT1 27 (SUTURE) ×4
SUT VIC AB 0 CT1 27XBRD ANBCTR (SUTURE) ×6 IMPLANT
SUT VIC AB 1 CT1 36 (SUTURE) ×10 IMPLANT
SUT VIC AB 2-0 CT1 27 (SUTURE) ×4
SUT VIC AB 2-0 CT1 TAPERPNT 27 (SUTURE) ×8 IMPLANT
SUT VIC AB 2-0 CT2 27 (SUTURE) IMPLANT
SYR 30ML LL (SYRINGE) ×6 IMPLANT
SYR TB 1ML LUER SLIP (SYRINGE) ×2 IMPLANT
TOWEL GREEN STERILE (TOWEL DISPOSABLE) ×4 IMPLANT
TOWEL GREEN STERILE FF (TOWEL DISPOSABLE) ×4 IMPLANT
TRAY CATH INTERMITTENT SS 16FR (CATHETERS) IMPLANT
WATER STERILE IRR 1000ML POUR (IV SOLUTION) IMPLANT
YANKAUER SUCT BULB TIP NO VENT (SUCTIONS) ×2 IMPLANT

## 2023-09-06 NOTE — Op Note (Unsigned)
NAMERANIER, COACH MEDICAL RECORD NO: [id number] ACCOUNT NO: [id number] DATE OF BIRTH: 07/20/1971 FACILITY: [facility] LOCATION: [facility] PHYSICIAN: Graylin Shiver. August Saucer, MD  Operative Report   DATE OF PROCEDURE: 09/06/2023  PREOPERATIVE DIAGNOSIS:  Left knee arthritis.  POSTOPERATIVE DIAGNOSIS:  Left knee arthritis.  PROCEDURE:  Left total knee replacement using [facility] cruciate-retaining size 10 left standard femur, size G tibia, 32 mm press-fit patella and 10 mm medial congruent polyethylene spacer.  SURGEON:  Graylin Shiver.  August Saucer, MD  ASSISTANT:  April Green, RFA  INDICATIONS:  This is a 52 year old patient with left knee pain refractory to nonoperative management who presents for operative management.  After explanation of risks and benefits.  DESCRIPTION OF PROCEDURE:  The patient was brought to the operating room where spinal anesthetic was induced.  Preoperative IV antibiotics were administered.  Timeout was called.  Left leg was pre-scrubbed with alcohol and Betadine, allowed to air dry,  prepped with DuraPrep solution and draped in a sterile manner.  Collier Flowers was used to cover the operative field.  After sterile prepping and draping and after we called the timeout, the leg was elevated and exsanguinated with Esmarch wrap.  Tourniquet was  inflated.  Anterior approach to the knee was made.  IrriSept solution was utilized.  Median parapatellar arthrotomy was then made and marked with a #1 Vicryl suture.  IrriSept solution again utilized.  Fat pad partially removed.  Patella was everted  after releasing the lateral patellofemoral ligament.  Medial soft tissue dissection was performed proportional to the patient's mild to moderate varus deformity.  Significant synovitis was present in the suprapatellar pouch.  This was removed from the  anterior distal femur as well as from the gutters.  Next, the patella was everted.  The knee was flexed.  ACL released.  Anterior horn lateral meniscus  released.  Posterior retractor placed and the PCL was released partially.  At this time,  intramedullary alignment was then used to make a cut on the tibia perpendicular to the mechanical axis.  Initial cut was approximately 2 mm below the most affected medial tibial plateau.  This was later revised 2 more mm to get underneath that sclerotic  bone as well as to obtain full extension.  Cut was made with the collaterals and neurovascular structures posteriorly protected.  Bone quality was excellent.  Intramedullary alignment was then used to cut the femur in 5 degrees of valgus at an 11 mm cut  because of the patient's preoperative flexion contracture of about 8 to 9 degrees.  With both those cuts made, we were able to get a 10 spacer in which gave very good stability in extension.  Next, the femur was cut in 3 degrees of external rotation,  which gave symmetric flexion and extension gaps.  Collaterals were protected.  At this time, the tibia was keel punched and sized to a size G, aligned with the medial third of the tibial tubercle.  Once that was performed, the patella was cut from 24 to  15 mm and a 32 x 9 mm patellar button was placed.  With trial components in position along with a 10 mm spacer, the patient achieved full extension, full flexion, and excellent patellar tracking using no thumbs technique.  The knee was stable to varus  and valgus stress at 0, 30, and 90 degrees with no lift-off.  Trial components were removed.  Final preparation was made on both the femur and the tibia before trial component removal.  Then, after we removed the trials, the capsule was anesthetized  using a combination of Marcaine, saline, and Exparel.  Before anesthetizing the capsule, we did irrigate with 3 liters of pulsatile irrigating solution.  Then, we anesthetized the capsule and at that point, we put in a TXA sponge along with IrriSept  solution for 3 minutes.  Next, this was sucked out and then we placed the  tibial component after placing some IrriSept solution into the tibial canal, sucking that out and placing some vancomycin powder in the canal.  Very good press fit obtained on the  tibia.  Next, the femur was placed and a 10 mm spacer was placed and then the patella was placed.  Same stability parameters were maintained.  3 liters of pouring irrigation utilized at this point.  The tourniquet was released.  Bleeding points were  encountered controlled using electrocautery.  Irrigation was again performed and then the arthrotomy was closed over a bolster using #1 Vicryl suture.  Prior to final arthrotomy closure, we did irrigate the knee for one final time using IrriSept solution  and placed in some vancomycin powder and then also closed up that arthrotomy and then injected the knee joint itself with Marcaine, morphine, and clonidine.  Next, we irrigated above the arthrotomy with IrriSept solution, placed in the rest of the  vancomycin powder, and then closed that using 0 Vicryl suture, 2-0 Vicryl suture, and 3-0 Monocryl with Steri-Strips, Aquacel dressing, Ace wrap, and knee immobilizer placed.  The patient tolerated the procedure well without immediate complications.   April's assistance was required.   PUS D: 09/06/2023 10:54:00 am T: 09/06/2023 11:14:00 am  JOB: [id number]/ [id number]

## 2023-09-06 NOTE — H&P (Signed)
TOTAL KNEE ADMISSION H&P  Patient is being admitted for left total knee arthroplasty.  Subjective:  Chief Complaint:left knee pain.  HPI: Bradley Burns, 52 y.o. male, has a history of pain and functional disability in the left knee due to arthritis and has failed non-surgical conservative treatments for greater than 12 weeks to includeNSAID's and/or analgesics, corticosteriod injections, viscosupplementation injections, flexibility and strengthening excercises, and activity modification.  Onset of symptoms was gradual, starting >10 years ago with gradually worsening course since that time. The patient noted prior procedures on the knee to include  arthroscopy and menisectomy on the left knee(s).  Patient currently rates pain in the left knee(s) at 8 out of 10 with activity. Patient has night pain, worsening of pain with activity and weight bearing, pain that interferes with activities of daily living, crepitus, and joint swelling.  Patient has evidence of subchondral sclerosis and joint space narrowing by imaging studies. This patient has had  a similar history of pain in the right knee but to a lesser degree.  Patient also denies any personal or family history of DVT or pulmonary embolism. . There is no active infection.  Patient Active Problem List   Diagnosis Date Noted   Hyperlipidemia 09/08/2019   Vitamin D deficiency 06/03/2017   Hypertension 05/25/2017   Seasonal allergies 05/25/2017   History of smoking for 2-5 years 05/25/2017   Social alcohol use 05/25/2017   Obesity (BMI 30-39.9) 05/25/2017   S/P appendectomy 05/25/2017   GERD (gastroesophageal reflux disease) 05/25/2017   Past Medical History:  Diagnosis Date   Allergy    Hypertension     Past Surgical History:  Procedure Laterality Date   ANKLE ARTHROSCOPY Bilateral    APPENDECTOMY     KNEE ARTHROSCOPY Bilateral    NASAL SEPTUM SURGERY     SHOULDER ARTHROSCOPY Left     Current Facility-Administered Medications   Medication Dose Route Frequency Provider Last Rate Last Admin   acetaminophen (TYLENOL) tablet 1,000 mg  1,000 mg Oral Once Jairo Ben, MD       ceFAZolin (ANCEF) IVPB 2g/100 mL premix  2 g Intravenous On Call to OR Magnant, Joycie Peek, PA-C       chlorhexidine (PERIDEX) 0.12 % solution 15 mL  15 mL Mouth/Throat Once Mariann Barter, MD       Or   Oral care mouth rinse  15 mL Mouth Rinse Once Mariann Barter, MD       lactated ringers infusion   Intravenous Continuous Mariann Barter, MD       povidone-iodine (BETADINE) 7.5 % scrub   Topical Once Magnant, Charles L, PA-C       povidone-iodine 10 % swab 2 Application  2 Application Topical Once Magnant, Charles L, PA-C       povidone-iodine 10 % swab 2 Application  2 Application Topical Once Magnant, Charles L, PA-C       tranexamic acid (CYKLOKAPRON) 2,000 mg in sodium chloride 0.9 % 50 mL Topical Application  2,000 mg Topical To OR Cammy Copa, MD       tranexamic acid (CYKLOKAPRON) IVPB 1,000 mg  1,000 mg Intravenous To OR Magnant, Charles L, PA-C       No Known Allergies  Social History   Tobacco Use   Smoking status: Never   Smokeless tobacco: Never  Substance Use Topics   Alcohol use: Yes    Alcohol/week: 4.0 standard drinks of alcohol    Types: 4 Standard drinks or equivalent  per week    Family History  Problem Relation Age of Onset   Hypertension Father      Review of Systems  Musculoskeletal:  Positive for arthralgias.  All other systems reviewed and are negative.   Objective:  Physical Exam Vitals reviewed.  HENT:     Head: Normocephalic.     Nose: Nose normal.     Mouth/Throat:     Mouth: Mucous membranes are moist.  Eyes:     Pupils: Pupils are equal, round, and reactive to light.  Cardiovascular:     Rate and Rhythm: Normal rate.     Pulses: Normal pulses.  Pulmonary:     Effort: Pulmonary effort is normal.  Abdominal:     General: Abdomen is flat.  Musculoskeletal:     Cervical back: Normal  range of motion.  Skin:    General: Skin is warm.     Capillary Refill: Capillary refill takes less than 2 seconds.  Neurological:     General: No focal deficit present.     Mental Status: He is alert.  Psychiatric:        Mood and Affect: Mood normal.   On physical examination of the left knee the skin is intact.  Pedal pulses palpable.  Mild varus alignment present.  Range of motion is 5-1 15.  Ankle dorsiflexion intact.  No groin pain with internal or external rotation of either leg.  Medial greater than lateral joint line tenderness is present.  Collaterals are stable. Vital signs in last 24 hours: Temp:  [98.9 F (37.2 C)] 98.9 F (37.2 C) (11/05 0542) Pulse Rate:  [76] 76 (11/05 0542) Resp:  [17] 17 (11/05 0542) BP: (155)/(98) 155/98 (11/05 0542) SpO2:  [96 %] 96 % (11/05 0542) Weight:  [113.9 kg] 113.9 kg (11/05 0542)  Labs:   Estimated body mass index is 35.01 kg/m as calculated from the following:   Height as of this encounter: 5\' 11"  (1.803 m).   Weight as of this encounter: 113.9 kg.   Imaging Review Plain radiographs demonstrate severe degenerative joint disease of the left knee(s). The overall alignment ismild varus. The bone quality appears to be good for age and reported activity level.      Assessment/Plan:  End stage arthritis, left knee   The patient history, physical examination, clinical judgment of the provider and imaging studies are consistent with end stage degenerative joint disease of the left knee(s) and total knee arthroplasty is deemed medically necessary. The treatment options including medical management, injection therapy arthroscopy and arthroplasty were discussed at length. The risks and benefits of total knee arthroplasty were presented and reviewed. The risks due to aseptic loosening, infection, stiffness, patella tracking problems, thromboembolic complications and other imponderables were discussed. The patient acknowledged the  explanation, agreed to proceed with the plan and consent was signed. Patient is being admitted for inpatient treatment for surgery, pain control, PT, OT, prophylactic antibiotics, VTE prophylaxis, progressive ambulation and ADL's and discharge planning. The patient is planning to be discharged home with home health services  Furthermore Impression is end-stage arthritis with left knee being more symptomatic than right knee. We talked about operative and nonoperative treatment options. In general his pain is becoming severe and untenable. We discussed simultaneous knee replacements but the Medicare data does show higher complication rate and mortality rate for patients in that cohort. The risks and benefits of knee replacement are discussed including not limited to infection or vessel damage incomplete pain relief as well as  incomplete restoration of function. I do think that he will improve after getting over the rigorous rehab required for knee replacement. Anticipate about 3 months out of physical work. Patient understands risk and benefits and wishes to proceed. All questions answered     Patient's anticipated LOS is less than 2 midnights, meeting these requirements: - Younger than 80 - Lives within 1 hour of care - Has a competent adult at home to recover with post-op recover - NO history of  - Chronic pain requiring opiods  - Diabetes  - Coronary Artery Disease  - Heart failure  - Heart attack  - Stroke  - DVT/VTE  - Cardiac arrhythmia  - Respiratory Failure/COPD  - Renal failure  - Anemia  - Advanced Liver disease

## 2023-09-06 NOTE — Transfer of Care (Signed)
Immediate Anesthesia Transfer of Care Note  Patient: Bradley Burns  Procedure(s) Performed: LEFT TOTAL KNEE ARTHROPLASTY (Left: Knee)  Patient Location: PACU  Anesthesia Type:Spinal  Level of Consciousness: awake, alert , and oriented  Airway & Oxygen Therapy: Patient Spontanous Breathing  Post-op Assessment: Report given to RN, Post -op Vital signs reviewed and stable, and Post -op Vital signs reviewed and unstable, Anesthesiologist notified  Post vital signs: Reviewed and stable  Last Vitals:  Vitals Value Taken Time  BP 96/84 09/06/23 1045  Temp    Pulse 88 09/06/23 1048  Resp 17 09/06/23 1048  SpO2 97 % 09/06/23 1048  Vitals shown include unfiled device data.  Last Pain:  Vitals:   09/06/23 0615  TempSrc:   PainSc: 0-No pain         Complications: No notable events documented.

## 2023-09-06 NOTE — Anesthesia Procedure Notes (Signed)
Procedure Name: MAC Date/Time: 09/06/2023 7:37 AM  Performed by: Marena Chancy, CRNAPre-anesthesia Checklist: Patient identified, Emergency Drugs available, Suction available, Patient being monitored and Timeout performed Patient Re-evaluated:Patient Re-evaluated prior to induction Oxygen Delivery Method: Simple face mask

## 2023-09-06 NOTE — Brief Op Note (Signed)
   09/06/2023  10:46 AM  PATIENT:  Bradley Burns  52 y.o. male  PRE-OPERATIVE DIAGNOSIS:  left knee osteoarthritis  POST-OPERATIVE DIAGNOSIS:  left knee osteoarthritis  PROCEDURE:  Procedure(s): LEFT TOTAL KNEE ARTHROPLASTY  SURGEON:  Surgeon(s): Cammy Copa, MD  ASSISTANT: April Green, RNFA  ANESTHESIA:   spinal  EBL: 75 ml    Total I/O In: 1100 [I.V.:1100] Out: 1950 [Urine:1900; Blood:50]  BLOOD ADMINISTERED: none  DRAINS: none   LOCAL MEDICATIONS USED: Marcaine morphine clonidine Exparel vancomycin powder  SPECIMEN:  No Specimen  COUNTS:  YES  TOURNIQUET:   Total Tourniquet Time Documented: Thigh (Left) - 83 minutes Total: Thigh (Left) - 83 minutes   DICTATION: .Other Dictation: Dictation Number [id number]  PLAN OF CARE: Admit for overnight observation  PATIENT DISPOSITION:  PACU - hemodynamically stable

## 2023-09-06 NOTE — Anesthesia Procedure Notes (Signed)
Spinal  Patient location during procedure: OR End time: 09/06/2023 7:44 AM Reason for block: surgical anesthesia Staffing Performed: anesthesiologist  Anesthesiologist: Jairo Ben, MD Performed by: Jairo Ben, MD Authorized by: Jairo Ben, MD   Preanesthetic Checklist Completed: patient identified, IV checked, site marked, risks and benefits discussed, surgical consent, monitors and equipment checked, pre-op evaluation and timeout performed Spinal Block Patient position: sitting Prep: DuraPrep Patient monitoring: heart rate, cardiac monitor, continuous pulse ox and blood pressure Approach: midline Location: L3-4 Injection technique: single-shot Needle Needle type: Pencan and Introducer  Needle gauge: 24 G Needle length: 9 cm Assessment Sensory level: T4 Events: CSF return Additional Notes Pt identified in Operating room.  Monitors applied. Working IV access confirmed. Sterile prep, drape lumbar spine.  1% lido local L 3,4.  #24ga Pencan into clear CSF L 3,4.  15mg  0.75% Bupivacaine with dextrose injected with asp CSF beginning and end of injection.  Patient asymptomatic, VSS, no heme aspirated, tolerated well.  Sandford Craze, MD

## 2023-09-06 NOTE — Evaluation (Signed)
Physical Therapy Evaluation Patient Details Name: Bradley Burns MRN: [id number] DOB: 10-Jul-1971 Today's Date: 09/06/2023  History of Present Illness  Patient is a 52 y/o male admitted 09/06/23 L TKA.  PMH positive for HLD, HTN, GERD, obesity.  Clinical Impression  Patient presents with decreased mobility due to pain, decreased strength and ROM LLE.  Patient previously active and working though recently using crutches.  Patient able to ambulate into hallway with RW and knee immobilizer today though mainly limited by pain.  Feel he will benefit from skilled PT in the acute setting to allow home with family support.       If plan is discharge home, recommend the following: Assistance with cooking/housework;Assist for transportation;Help with stairs or ramp for entrance   Can travel by private vehicle        Equipment Recommendations Rolling walker (2 wheels);BSC/3in1  Recommendations for Other Services       Functional Status Assessment Patient has had a recent decline in their functional status and demonstrates the ability to make significant improvements in function in a reasonable and predictable amount of time.     Precautions / Restrictions Precautions Precautions: Fall Required Braces or Orthoses: Knee Immobilizer - Left Restrictions LLE Weight Bearing: Weight bearing as tolerated      Mobility  Bed Mobility Overal bed mobility: Needs Assistance Bed Mobility: Supine to Sit, Sit to Supine     Supine to sit: Min assist Sit to supine: Min assist   General bed mobility comments: A for L LE    Transfers Overall transfer level: Needs assistance Equipment used: Rolling walker (2 wheels) Transfers: Sit to/from Stand Sit to Stand: Contact guard assist, From elevated surface           General transfer comment: cues for technique with hand placement and wearing knee immobilizer    Ambulation/Gait Ambulation/Gait assistance: Contact guard assist Gait Distance  (Feet): 140 Feet Assistive device: Rolling walker (2 wheels) Gait Pattern/deviations: Step-to pattern, Step-through pattern, Antalgic       General Gait Details: cues initially for sequence, stopping to rest after several steps due to pain  Stairs            Wheelchair Mobility     Tilt Bed    Modified Rankin (Stroke Patients Only)       Balance Overall balance assessment: Needs assistance   Sitting balance-Leahy Scale: Good     Standing balance support: No upper extremity supported Standing balance-Leahy Scale: Fair Standing balance comment: using walker for weight off L LE with ambulation, can stand without UE support                             Pertinent Vitals/Pain Pain Assessment Pain Assessment: Faces Faces Pain Scale: Hurts whole lot Pain Location: L knee Pain Descriptors / Indicators: Aching, Discomfort, Grimacing, Guarding Pain Intervention(s): Monitored during session, Limited activity within patient's tolerance, Ice applied, Premedicated before session    Home Living Family/patient expects to be discharged to:: Private residence Living Arrangements: Spouse/significant other Available Help at Discharge: Family Type of Home: House Home Access: Stairs to enter Entrance Stairs-Rails: Doctor, general practice of Steps: 5   Home Layout: One level Home Equipment: Agricultural consultant (2 wheels);Crutches      Prior Function Prior Level of Function : Independent/Modified Independent             Mobility Comments: using crutches some lately ADLs Comments: working driving trucks  Extremity/Trunk Assessment   Upper Extremity Assessment Upper Extremity Assessment: Overall WFL for tasks assessed    Lower Extremity Assessment Lower Extremity Assessment: LLE deficits/detail LLE Deficits / Details: ankle AROM WFL, knee flexion AROM about 40, knee extension strength 2+/5       Communication   Communication Communication: No  apparent difficulties  Cognition Arousal: Alert Behavior During Therapy: WFL for tasks assessed/performed Overall Cognitive Status: Within Functional Limits for tasks assessed                                          General Comments      Exercises Total Joint Exercises Ankle Circles/Pumps: AROM, 5 reps, Both, Supine Quad Sets: AROM, 5 reps, Left, Supine Heel Slides: AAROM, 5 reps, Left, Supine   Assessment/Plan    PT Assessment Patient needs continued PT services  PT Problem List Pain;Decreased mobility;Decreased range of motion;Decreased balance;Decreased strength       PT Treatment Interventions DME instruction;Functional mobility training;Balance training;Patient/family education;Gait training;Stair training;Therapeutic exercise;Therapeutic activities    PT Goals (Current goals can be found in the Care Plan section)  Acute Rehab PT Goals Patient Stated Goal: return to independent PT Goal Formulation: With patient/family Time For Goal Achievement: 09/13/23 Potential to Achieve Goals: Good    Frequency Min 1X/week     Co-evaluation               AM-PAC PT "6 Clicks" Mobility  Outcome Measure Help needed turning from your back to your side while in a flat bed without using bedrails?: A Little Help needed moving from lying on your back to sitting on the side of a flat bed without using bedrails?: A Little Help needed moving to and from a bed to a chair (including a wheelchair)?: A Little Help needed standing up from a chair using your arms (e.g., wheelchair or bedside chair)?: A Little Help needed to walk in hospital room?: A Little Help needed climbing 3-5 steps with a railing? : Total 6 Click Score: 16    End of Session Equipment Utilized During Treatment: Gait belt;Left knee immobilizer Activity Tolerance: Patient limited by pain Patient left: in bed;with call bell/phone within reach;with family/visitor present   PT Visit Diagnosis:  Difficulty in walking, not elsewhere classified (R26.2)    Time: 1610-1630 PT Time Calculation (min) (ACUTE ONLY): 20 min   Charges:   PT Evaluation $PT Eval Low Complexity: 1 Low   PT General Charges $$ ACUTE PT VISIT: 1 Visit         Sheran Lawless, PT Acute Rehabilitation Services Office:[phone] 09/06/2023   Elray Mcgregor 09/06/2023, 5:43 PM

## 2023-09-06 NOTE — Progress Notes (Signed)
Orthopedic Tech Progress Note Patient Details:  Bradley Burns 23-Sep-1971 [id number]  CPM Left Knee CPM Left Knee: On Left Knee Flexion (Degrees): 10 Left Knee Extension (Degrees): 40  Post Interventions Patient Tolerated: Well Ortho Devices Type of Ortho Device: Bone foam zero knee Ortho Device/Splint Location: LLE Ortho Device/Splint Interventions: Ordered, Application   Post Interventions Patient Tolerated: Well  Lyrical Sowle A Johni Narine 09/06/2023, 5:16 PM

## 2023-09-06 NOTE — Anesthesia Procedure Notes (Signed)
Anesthesia Regional Block: Adductor canal block   Pre-Anesthetic Checklist: , timeout performed,  Correct Patient, Correct Site, Correct Laterality,  Correct Procedure, Correct Position, site marked,  Risks and benefits discussed,  Surgical consent,  Pre-op evaluation,  At surgeon's request and post-op pain management  Laterality: Left and Lower  Prep: chloraprep       Needles:  Injection technique: Single-shot  Needle Type: Echogenic Needle     Needle Length: 9cm  Needle Gauge: 21     Additional Needles:   Procedures:,,,, ultrasound used (permanent image in chart),,    Narrative:  Start time: 09/06/2023 7:21 AM End time: 09/06/2023 7:27 AM Injection made incrementally with aspirations every 5 mL.  Performed by: Personally  Anesthesiologist: Jairo Ben, MD  Additional Notes: Pt identified in Holding room.  Monitors applied. Working IV access confirmed. Timeout, Sterile prep L thigh.  #21ga ECHOgenic Arrow block needle into adductor canal with US guidance.  20cc 0.75% Ropivacaine injected incrementally after negative test dose.  Patient asymptomatic, VSS, no heme aspirated, tolerated well.   Sandford Craze, MD

## 2023-09-06 NOTE — Anesthesia Postprocedure Evaluation (Signed)
Anesthesia Post Note  Patient: JOAOPEDRO ESCHBACH  Procedure(s) Performed: LEFT TOTAL KNEE ARTHROPLASTY (Left: Knee)     Patient location during evaluation: PACU Anesthesia Type: Spinal Level of consciousness: awake and alert, patient cooperative and oriented Pain management: pain level controlled Vital Signs Assessment: post-procedure vital signs reviewed and stable Respiratory status: spontaneous breathing, nonlabored ventilation and respiratory function stable Cardiovascular status: blood pressure returned to baseline and stable Postop Assessment: no apparent nausea or vomiting, patient able to bend at knees and spinal receding Anesthetic complications: no   There were no known notable events for this encounter.  Last Vitals:  Vitals:   09/06/23 1045 09/06/23 1100  BP: 96/84 (!) 118/91  Pulse: 82 82  Resp: 16 17  Temp:    SpO2: 96% 98%    Last Pain:  Vitals:   09/06/23 1043  TempSrc:   PainSc: 0-No pain                 Riva Sesma Coffield,E. Helaman [place]

## 2023-09-07 ENCOUNTER — Encounter (HOSPITAL_COMMUNITY): Payer: Self-pay | Admitting: Orthopedic Surgery

## 2023-09-07 DIAGNOSIS — M1712 Unilateral primary osteoarthritis, left knee: Secondary | ICD-10-CM | POA: Diagnosis not present

## 2023-09-07 MED ORDER — OXYCODONE HCL 5 MG PO TABS
5.0000 mg | ORAL_TABLET | ORAL | Status: DC | PRN
  Administered 2023-09-07: 5 mg via ORAL
  Administered 2023-09-07: 10 mg via ORAL
  Filled 2023-09-07: qty 2

## 2023-09-07 NOTE — Progress Notes (Signed)
  Subjective: Patient stable.  Pain controlled on oxycodone.   Objective: Vital signs in last 24 hours: Temp:  [97.5 F (36.4 C)-98.1 F (36.7 C)] 97.8 F (36.6 C) (11/06 0734) Pulse Rate:  [53-78] 66 (11/06 0734) Resp:  [17-18] 17 (11/06 0734) BP: (100-140)/(82-112) 134/112 (11/06 0734) SpO2:  [96 %-100 %] 100 % (11/05 1530)  Intake/Output from previous day: 11/05 0701 - 11/06 0700 In: 2532.8 [P.O.:480; I.V.:1802.8; IV Piggyback:250] Out: 3200 [Urine:3150; Blood:50] Intake/Output this shift: Total I/O In: 240 [P.O.:240] Out: 550 [Urine:550]  Exam:  No cellulitis present Compartment soft Negative Homans no calf tenderness ankle dorsiflexion intact.  Labs: No results for input(s): "HGB" in the last 72 hours. No results for input(s): "WBC", "RBC", "HCT", "PLT" in the last 72 hours. No results for input(s): "NA", "K", "CL", "CO2", "BUN", "CREATININE", "GLUCOSE", "CALCIUM" in the last 72 hours. No results for input(s): "LABPT", "INR" in the last 72 hours.  Assessment/Plan: Plan at this time is to see how him does with physical therapy with stairs this afternoon.  If he does okay then the plan would be for discharge to home.  We discussed pain medication and he is going to hold off on the tramadol and instead take oxycodone which he is tolerating.  May need 1 to 2 tablets every 3 to 4 hours early on.  Also will be discharged home on Celebrex 1 baby aspirin twice a day and Robaxin along with his preop medications.   Bradley Burns 09/07/2023, 12:37 PM

## 2023-09-07 NOTE — Progress Notes (Signed)
S/p Left total knee replacement    09/07/23 1544  TOC Brief Assessment  Insurance and Status Reviewed  Patient has primary care physician No  Home environment has been reviewed from home with family  Prior level of function: PTA independent with ADL's,  has RW and BSC @ HOME  Prior/Current Home Services No current home services  Social Determinants of Health Reivew SDOH reviewed no interventions necessary  Readmission risk has been reviewed No  Transition of care needs no transition of care needs at this time   Home health PT services noted. Pt agreeable with home health services. No provider preference. Referral made with [facility] and accepted. Pt without DME needs. Pt without RX med concerns.Daughter to provide transportation to home once d/c.  [facility] team following and will assist with needs.... Gae Gallop RN,BSN,CM [phone]

## 2023-09-07 NOTE — Plan of Care (Signed)
  Problem: Education: Goal: Knowledge of General Education information will improve Description: Including pain rating scale, medication(s)/side effects and non-pharmacologic comfort measures Outcome: Progressing   Problem: Health Behavior/Discharge Planning: Goal: Ability to manage health-related needs will improve Outcome: Progressing   Problem: Clinical Measurements: Goal: Ability to maintain clinical measurements within normal limits will improve Outcome: Progressing Goal: Will remain free from infection Outcome: Progressing Goal: Diagnostic test results will improve Outcome: Progressing Goal: Respiratory complications will improve Outcome: Progressing Goal: Cardiovascular complication will be avoided Outcome: Progressing   Problem: Activity: Goal: Risk for activity intolerance will decrease Outcome: Progressing   Problem: Nutrition: Goal: Adequate nutrition will be maintained Outcome: Progressing   Problem: Coping: Goal: Level of anxiety will decrease Outcome: Progressing   Problem: Elimination: Goal: Will not experience complications related to bowel motility Outcome: Progressing Goal: Will not experience complications related to urinary retention Outcome: Progressing   Problem: Pain Management: Goal: General experience of comfort will improve Outcome: Progressing   Problem: Safety: Goal: Ability to remain free from injury will improve Outcome: Progressing   Problem: Skin Integrity: Goal: Risk for impaired skin integrity will decrease Outcome: Progressing   Problem: Education: Goal: Knowledge of the prescribed therapeutic regimen will improve Outcome: Progressing Goal: Individualized Educational Video(s) Outcome: Progressing   Problem: Activity: Goal: Ability to avoid complications of mobility impairment will improve Outcome: Progressing Goal: Range of joint motion will improve Outcome: Progressing   Problem: Clinical Measurements: Goal:  Postoperative complications will be avoided or minimized Outcome: Progressing   Problem: Pain Management: Goal: Pain level will decrease with appropriate interventions Outcome: Progressing   Problem: Skin Integrity: Goal: Will show signs of wound healing Outcome: Progressing

## 2023-09-07 NOTE — Progress Notes (Signed)
Physical Therapy Treatment Patient Details Name: Bradley Burns MRN: [id number] DOB: 1971/10/14 Today's Date: 09/07/2023   History of Present Illness Patient is a 52 y/o male admitted 09/06/23 L TKA.  PMH positive for HLD, HTN, GERD, obesity.    PT Comments  Pt received in supine and agreeable to session with family present and supportive throughout. Pt receiving pain medicine upon entry, however reporting significant L knee pain and tightness. Pt provided and educated on TKR HEP, however pt unable to demonstrate short arc quad or SLR due to quad weakness. Pt able to complete stair trial with CGA for safety and no L knee instability noted. Pt's daughter present and educated on guarding technique. Pt continues to be limited by decreased L knee ROM, strength, and pain, however is able to complete mobility tasks with CGA. Education provided on safe home navigation and reduced fall risk with use of RW. Pt and family reporting being comfortable with assist at home and all questions answered. Anticipate pt and family will be able to manage pt's mobility needs at home once medically ready for discharge.    If plan is discharge home, recommend the following: Assistance with cooking/housework;Assist for transportation;Help with stairs or ramp for entrance   Can travel by private vehicle        Equipment Recommendations  Rolling walker (2 wheels);BSC/3in1    Recommendations for Other Services       Precautions / Restrictions Precautions Precautions: Fall Restrictions Weight Bearing Restrictions: Yes LLE Weight Bearing: Weight bearing as tolerated     Mobility  Bed Mobility Overal bed mobility: Needs Assistance Bed Mobility: Supine to Sit, Sit to Supine     Supine to sit: Contact guard Sit to supine: Min assist   General bed mobility comments: Pt using BUE to advance LLE to EOB and gait belt with min A to elevate LLE back to EOB    Transfers Overall transfer level: Needs  assistance Equipment used: Rolling walker (2 wheels) Transfers: Sit to/from Stand Sit to Stand: Supervision           General transfer comment: Cues for hand placement    Ambulation/Gait Ambulation/Gait assistance: Contact guard assist Gait Distance (Feet): 10 Feet Assistive device: Rolling walker (2 wheels) Gait Pattern/deviations: Step-to pattern, Antalgic, Decreased stance time - left       General Gait Details: Pt demonstrating antalgic gait with heavy reliance on BUE support on RW to offload LLE. Slightly improved L knee stability   Stairs Stairs: Yes Stairs assistance: Contact guard assist Stair Management: One rail Right Number of Stairs: 12 General stair comments: Cues for sequencing and CGA for safety. Pt's daughter present and educated on guarding technique. No L knee buckling noted       Balance Overall balance assessment: Needs assistance Sitting-balance support: No upper extremity supported, Feet supported Sitting balance-Leahy Scale: Good Sitting balance - Comments: sitting EOB   Standing balance support: Bilateral upper extremity supported, Reliant on assistive device for balance, During functional activity Standing balance-Leahy Scale: Poor Standing balance comment: reliant on RW for ambulation                            Cognition Arousal: Alert Behavior During Therapy: WFL for tasks assessed/performed Overall Cognitive Status: Within Functional Limits for tasks assessed  Exercises Total Joint Exercises Ankle Circles/Pumps: AROM, 5 reps, Both, Supine Quad Sets: AROM, 5 reps, Left, Supine Heel Slides: AAROM, 5 reps, Left, Supine Knee Flexion: AROM, Seated, Left, 5 reps    General Comments        Pertinent Vitals/Pain Pain Assessment Pain Assessment: Faces Faces Pain Scale: Hurts whole lot Pain Location: L knee Pain Descriptors / Indicators: Aching, Discomfort,  Grimacing, Guarding Pain Intervention(s): Monitored during session, Repositioned     PT Goals (current goals can now be found in the care plan section) Acute Rehab PT Goals Patient Stated Goal: return to independent PT Goal Formulation: With patient/family Time For Goal Achievement: 09/13/23 Progress towards PT goals: Progressing toward goals    Frequency    Min 1X/week       AM-PAC PT "6 Clicks" Mobility   Outcome Measure  Help needed turning from your back to your side while in a flat bed without using bedrails?: None Help needed moving from lying on your back to sitting on the side of a flat bed without using bedrails?: A Little Help needed moving to and from a bed to a chair (including a wheelchair)?: A Little Help needed standing up from a chair using your arms (e.g., wheelchair or bedside chair)?: A Little Help needed to walk in hospital room?: A Little Help needed climbing 3-5 steps with a railing? : A Little 6 Click Score: 19    End of Session Equipment Utilized During Treatment: Gait belt Activity Tolerance: Patient limited by pain;Patient tolerated treatment well Patient left: in bed;with call bell/phone within reach;with family/visitor present Nurse Communication: Mobility status PT Visit Diagnosis: Difficulty in walking, not elsewhere classified (R26.2)     Time: 6213-0865 PT Time Calculation (min) (ACUTE ONLY): 39 min  Charges:    $Gait Training: 8-22 mins $Therapeutic Exercise: 23-37 mins PT General Charges $$ ACUTE PT VISIT: 1 Visit                     Johny Shock, PTA Acute Rehabilitation Services Secure Chat Preferred  [phone]    Johny Shock 09/07/2023, 4:24 PM

## 2023-09-07 NOTE — Progress Notes (Signed)
Discharge instructions given. Patient verbalized understanding and all questions were answered.  ?

## 2023-09-07 NOTE — Progress Notes (Signed)
Physical Therapy Treatment Patient Details Name: Bradley Burns MRN: [id number] DOB: 06/24/1971 Today's Date: 09/07/2023   History of Present Illness Patient is a 52 y/o male admitted 09/06/23 L TKA.  PMH positive for HLD, HTN, GERD, obesity.    PT Comments  Pt received in supine and agreeable to session. Pt limited by significant L knee pain despite recent pain medication. Pt able to tolerate gait trial in the hallway, however demonstrates limited LLE WB tolerance and requires standing rest breaks due to pain. Pt demonstrates some L knee instability, but no overt buckling during ambulation. Pt able to stand at toilet to void without assist. Plan for stair trial later today as able. Pt continues to benefit from PT services to progress toward functional mobility goals.    If plan is discharge home, recommend the following: Assistance with cooking/housework;Assist for transportation;Help with stairs or ramp for entrance   Can travel by private vehicle        Equipment Recommendations  Rolling walker (2 wheels);BSC/3in1    Recommendations for Other Services       Precautions / Restrictions Precautions Precautions: Fall Restrictions Weight Bearing Restrictions: Yes LLE Weight Bearing: Weight bearing as tolerated     Mobility  Bed Mobility Overal bed mobility: Needs Assistance Bed Mobility: Supine to Sit, Sit to Supine     Supine to sit: Min assist Sit to supine: Contact guard assist   General bed mobility comments: Min A for LLE advancement to EOB. pt instructed in use of gait belt to elevate LLE back to EOB and is able to demo. increased time due to pain    Transfers Overall transfer level: Needs assistance Equipment used: Rolling walker (2 wheels) Transfers: Sit to/from Stand Sit to Stand: Contact guard assist, From elevated surface           General transfer comment: Cues for hand placement    Ambulation/Gait Ambulation/Gait assistance: Contact guard  assist Gait Distance (Feet): 60 Feet Assistive device: Rolling walker (2 wheels) Gait Pattern/deviations: Step-to pattern, Antalgic, Decreased stance time - left       General Gait Details: Pt demonstrating antalgic gait with heavy reliance on BUE support on RW to offload LLE. Slight L knee instability noted, but no buckling       Balance Overall balance assessment: Needs assistance Sitting-balance support: No upper extremity supported, Feet supported Sitting balance-Leahy Scale: Good Sitting balance - Comments: sitting EOB   Standing balance support: Bilateral upper extremity supported, Reliant on assistive device for balance, During functional activity Standing balance-Leahy Scale: Poor Standing balance comment: reliant on RW for ambulation                            Cognition Arousal: Alert Behavior During Therapy: WFL for tasks assessed/performed Overall Cognitive Status: Within Functional Limits for tasks assessed                                          Exercises      General Comments        Pertinent Vitals/Pain Pain Assessment Pain Assessment: Faces Faces Pain Scale: Hurts whole lot Pain Location: L knee Pain Descriptors / Indicators: Aching, Discomfort, Grimacing, Guarding Pain Intervention(s): Limited activity within patient's tolerance, Monitored during session, Repositioned     PT Goals (current goals can now be found in the care plan section)  Acute Rehab PT Goals Patient Stated Goal: return to independent PT Goal Formulation: With patient/family Time For Goal Achievement: 09/13/23 Progress towards PT goals: Progressing toward goals    Frequency    Min 1X/week       AM-PAC PT "6 Clicks" Mobility   Outcome Measure  Help needed turning from your back to your side while in a flat bed without using bedrails?: A Little Help needed moving from lying on your back to sitting on the side of a flat bed without using  bedrails?: A Little Help needed moving to and from a bed to a chair (including a wheelchair)?: A Little Help needed standing up from a chair using your arms (e.g., wheelchair or bedside chair)?: A Little Help needed to walk in hospital room?: A Little Help needed climbing 3-5 steps with a railing? : Total 6 Click Score: 16    End of Session Equipment Utilized During Treatment: Gait belt Activity Tolerance: Patient limited by pain Patient left: in bed;with call bell/phone within reach;with family/visitor present;in CPM Nurse Communication: Mobility status PT Visit Diagnosis: Difficulty in walking, not elsewhere classified (R26.2)     Time: 8295-6213 PT Time Calculation (min) (ACUTE ONLY): 32 min  Charges:    $Gait Training: 23-37 mins PT General Charges $$ ACUTE PT VISIT: 1 Visit                     Johny Shock, PTA Acute Rehabilitation Services Secure Chat Preferred  [phone]    Johny Shock 09/07/2023, 12:48 PM

## 2023-09-09 ENCOUNTER — Telehealth: Payer: Self-pay | Admitting: Orthopedic Surgery

## 2023-09-09 NOTE — Telephone Encounter (Signed)
Verbal orders from [facility]  1 time a week for 1 week  4 times a week for 1 week  Twice a week for 1 week  Best call back [phone]

## 2023-09-12 ENCOUNTER — Telehealth: Payer: Self-pay | Admitting: Orthopedic Surgery

## 2023-09-12 NOTE — Telephone Encounter (Signed)
IC verbal given.  

## 2023-09-12 NOTE — Telephone Encounter (Signed)
Maria (PT) from [facility] called requesting verbal orders for 1wk 1, 4wk 1, and 2wk 1. Please call Larey Brick on secure line at [phone].

## 2023-09-12 NOTE — Telephone Encounter (Signed)
[employer] forms received. To [employer]. 

## 2023-09-13 ENCOUNTER — Telehealth: Payer: Self-pay | Admitting: Orthopedic Surgery

## 2023-09-13 NOTE — Telephone Encounter (Signed)
Patient called and needs to put in for post op appointment. Also, needs a refill on pain medication. GN#[phone]

## 2023-09-14 ENCOUNTER — Other Ambulatory Visit: Payer: Self-pay | Admitting: Surgical

## 2023-09-14 MED ORDER — OXYCODONE HCL 5 MG PO TABS: 5.0000 mg | ORAL_TABLET | Freq: Four times a day (QID) | ORAL | 0 refills | Status: DC | PRN

## 2023-09-14 NOTE — Telephone Encounter (Signed)
Sent in refill

## 2023-09-15 ENCOUNTER — Other Ambulatory Visit: Payer: Self-pay

## 2023-09-15 NOTE — Progress Notes (Signed)
Patient reports he is out of muscle relaxer but would like to try to do without it, will call us if he decides he needs a refill.

## 2023-09-15 NOTE — Telephone Encounter (Signed)
scheduled

## 2023-09-19 ENCOUNTER — Other Ambulatory Visit (INDEPENDENT_AMBULATORY_CARE_PROVIDER_SITE_OTHER): Payer: BC Managed Care – PPO

## 2023-09-19 ENCOUNTER — Encounter: Payer: Self-pay | Admitting: Surgical

## 2023-09-19 ENCOUNTER — Ambulatory Visit (INDEPENDENT_AMBULATORY_CARE_PROVIDER_SITE_OTHER): Payer: BC Managed Care – PPO | Admitting: Surgical

## 2023-09-19 ENCOUNTER — Other Ambulatory Visit: Payer: Self-pay | Admitting: Surgical

## 2023-09-19 DIAGNOSIS — Z96652 Presence of left artificial knee joint: Secondary | ICD-10-CM

## 2023-09-19 MED ORDER — METHOCARBAMOL 500 MG PO TABS: 500.0000 mg | ORAL_TABLET | Freq: Four times a day (QID) | ORAL | 0 refills | Status: AC | PRN

## 2023-09-19 MED ORDER — GABAPENTIN 100 MG PO CAPS: 200.0000 mg | ORAL_CAPSULE | Freq: Three times a day (TID) | ORAL | 0 refills | Status: AC

## 2023-09-19 NOTE — Progress Notes (Signed)
   Post-Op Visit Note   Patient: Bradley Burns           Date of Birth: Nov 25, 1970           MRN: [id number] Visit Date: 09/19/2023 PCP: Pcp, No   Assessment & Plan:  Chief Complaint:  Chief Complaint  Patient presents with   Left Knee - Routine Post Op    L TKA 09/06/23   Visit Diagnoses:  1. S/P total knee arthroplasty, left     Plan: Bradley Burns is a 52 y.o. male who presents s/p left total knee arthroplasty on 09/06/2023.  Doing well overall.  Using CPM machine up to 105 degrees.  They deny any calf pain, shortness of breath, chest pain, abdominal pain.  Pain is overall controlled and taking oxycodone, gabapentin, Robaxin for pain control.  Taking aspirin twice daily for DVT prophylaxis.  Ambulating with crutches.  Sometimes does not need the crutches at home..   On exam, patient has range of motion 5 degrees extension to 105 degrees knee flexion.  Incision is healing well without evidence of infection or dehiscence.  2+ DP pulse of the operative extremity.  No calf tenderness, negative Homans' sign.  Able to perform straight leg raise.  Intact ankle dorsiflexion.  Plan is continue with range of motion exercises and using CPM machine.  He will start physical therapy upstairs to focus on knee range of motion, quadricep strengthening, gait training.  He looks to be progressing very well and his range of motion is excellent for only 2 weeks out from knee replacement.  Follow-up in 4 weeks for clinical recheck with Dr. August Saucer..    Follow-Up Instructions: No follow-ups on file.   Orders:  Orders Placed This Encounter  Procedures   XR Knee 1-2 Views Left   No orders of the defined types were placed in this encounter.   Imaging: No results found.  PMFS History: Patient Active Problem List   Diagnosis Date Noted   OA (osteoarthritis) of knee 09/06/2023   Arthritis of knee 09/06/2023   Hyperlipidemia 09/08/2019   Vitamin D deficiency 06/03/2017   Hypertension 05/25/2017    Seasonal allergies 05/25/2017   History of smoking for 2-5 years 05/25/2017   Social alcohol use 05/25/2017   Obesity (BMI 30-39.9) 05/25/2017   S/P appendectomy 05/25/2017   GERD (gastroesophageal reflux disease) 05/25/2017   Past Medical History:  Diagnosis Date   Allergy    Hypertension     Family History  Problem Relation Age of Onset   Hypertension Father     Past Surgical History:  Procedure Laterality Date   ANKLE ARTHROSCOPY Bilateral    APPENDECTOMY     KNEE ARTHROSCOPY Bilateral    NASAL SEPTUM SURGERY     SHOULDER ARTHROSCOPY Left    TOTAL KNEE ARTHROPLASTY Left 09/06/2023   Procedure: LEFT TOTAL KNEE ARTHROPLASTY;  Surgeon: Cammy Copa, MD;  Location: [facility] OR;  Service: Orthopedics;  Laterality: Left;  RNFA APRIL PLEASE   Social History   Occupational History   Not on file  Tobacco Use   Smoking status: Never   Smokeless tobacco: Never  Vaping Use   Vaping status: Never Used  Substance and Sexual Activity   Alcohol use: Yes    Alcohol/week: 4.0 standard drinks of alcohol    Types: 4 Standard drinks or equivalent per week   Drug use: No   Sexual activity: Not Currently

## 2023-09-19 NOTE — Addendum Note (Signed)
Addended by: Rogers Seeds on: 09/19/2023 01:45 PM   Modules accepted: Orders

## 2023-09-21 ENCOUNTER — Ambulatory Visit (INDEPENDENT_AMBULATORY_CARE_PROVIDER_SITE_OTHER): Payer: BC Managed Care – PPO | Admitting: Rehabilitative and Restorative Service Providers"

## 2023-09-21 ENCOUNTER — Encounter: Payer: Self-pay | Admitting: Rehabilitative and Restorative Service Providers"

## 2023-09-21 DIAGNOSIS — M25662 Stiffness of left knee, not elsewhere classified: Secondary | ICD-10-CM | POA: Diagnosis not present

## 2023-09-21 DIAGNOSIS — M6281 Muscle weakness (generalized): Secondary | ICD-10-CM | POA: Diagnosis not present

## 2023-09-21 DIAGNOSIS — R262 Difficulty in walking, not elsewhere classified: Secondary | ICD-10-CM

## 2023-09-21 DIAGNOSIS — M25562 Pain in left knee: Secondary | ICD-10-CM

## 2023-09-21 DIAGNOSIS — R600 Localized edema: Secondary | ICD-10-CM

## 2023-09-21 NOTE — Therapy (Signed)
OUTPATIENT PHYSICAL THERAPY LOWER EXTREMITY EVALUATION   Patient Name: Bradley Burns MRN: [id number] DOB:[birth year], 52 y.o., male Today's Date: 09/21/2023  END OF SESSION:  PT End of Session - 09/21/23 1413     Visit Number 1    Number of Visits 16    Date for PT Re-Evaluation 11/16/23    Authorization Type [employer]    Authorization - Number of Visits 60    PT Start Time 1320    PT Stop Time 1410    PT Time Calculation (min) 50 min    Activity Tolerance Patient tolerated treatment well;No increased pain;Patient limited by pain    Behavior During Therapy [facility] for tasks assessed/performed             Past Medical History:  Diagnosis Date   Allergy    Hypertension    Past Surgical History:  Procedure Laterality Date   ANKLE ARTHROSCOPY Bilateral    APPENDECTOMY     KNEE ARTHROSCOPY Bilateral    NASAL SEPTUM SURGERY     SHOULDER ARTHROSCOPY Left    TOTAL KNEE ARTHROPLASTY Left 09/06/2023   Procedure: LEFT TOTAL KNEE ARTHROPLASTY;  Surgeon: Cammy Copa, MD;  Location: [facility] OR;  Service: Orthopedics;  Laterality: Left;  RNFA APRIL PLEASE   Patient Active Problem List   Diagnosis Date Noted   OA (osteoarthritis) of knee 09/06/2023   Arthritis of knee 09/06/2023   Hyperlipidemia 09/08/2019   Vitamin D deficiency 06/03/2017   Hypertension 05/25/2017   Seasonal allergies 05/25/2017   History of smoking for 2-5 years 05/25/2017   Social alcohol use 05/25/2017   Obesity (BMI 30-39.9) 05/25/2017   S/P appendectomy 05/25/2017   GERD (gastroesophageal reflux disease) 05/25/2017    PCP: NA  REFERRING PROVIDER: Julieanne Cotton, PA-C  REFERRING DIAG:  Diagnosis  [phone] (ICD-10-CM) - S/P total knee arthroplasty, left    THERAPY DIAG:  Difficulty in walking, not elsewhere classified  Localized edema  Muscle weakness (generalized)  Stiffness of left knee, not elsewhere classified  Left knee pain, unspecified chronicity  Rationale for Evaluation and  Treatment: Rehabilitation  ONSET DATE: 09/06/2023 TKA  SUBJECTIVE:   SUBJECTIVE STATEMENT: Bradley Burns notes this is a "wear and tear" injury and his right knee is also a TKA candidate.  Bradley Burns has already cut back on pain medication, although he does need it at times.  Sleep is interrupted and he wakes up 2-3 time a night.  PERTINENT HISTORY: HTN, HLD, Lt shoulder scope, Lt TKA 09/06/2023, obesity   PAIN:  Are you having pain? Yes: NPRS scale: 2-9/10 Pain location: Lt > Rt knee Pain description: Ache, sharp and throbbing at times Aggravating factors: Prolonged postures, particularly WB Relieving factors: Ice, meds as needed  PRECAUTIONS: None  RED FLAGS: None   WEIGHT BEARING RESTRICTIONS: No  FALLS:  Has patient fallen in last 6 months? No  LIVING ENVIRONMENT: Lives with: lives with their family and lives with their spouse Lives in: Mobile home Stairs:  OK with crutches and handrail Has following equipment at home: Single point cane, Environmental consultant - 2 wheeled, and Crutches  OCCUPATION: Truck driver, out of work presently  PLOF: Independent  PATIENT GOALS: Return to work, working on cars, Catering manager...  NEXT MD VISIT: 10/17/2023  OBJECTIVE:  Note: Objective measures were completed at Evaluation unless otherwise noted.  DIAGNOSTIC FINDINGS: AP and lateral views of the knee reviewed.  Total knee arthroplasty  prosthesis in good position and alignment without any complicating  features.  There is no  evidence of dislocation, periprosthetic fracture,  patella baja/alta  PATIENT SURVEYS:  FOTO 36 (Goal 56 in 16 visits)  COGNITION: Overall cognitive status: Within functional limits for tasks assessed     SENSATION: No tingling or numbness  EDEMA:   Noted and not objectively assessed   LOWER EXTREMITY ROM:  Active ROM Left/Right 09/21/2023   Hip flexion    Hip extension    Hip abduction    Hip adduction    Hip internal rotation    Hip external rotation    Knee flexion 86/142    Knee extension -8/-2   Ankle dorsiflexion    Ankle plantarflexion    Ankle inversion    Ankle eversion     (Blank rows = not tested)  LOWER EXTREMITY STRENGTH: Deferred at evaluation  MMT Left/Right 09/21/2023   Hip flexion    Hip extension    Hip abduction    Hip adduction    Hip internal rotation    Hip external rotation    Knee flexion    Knee extension    Ankle dorsiflexion    Ankle plantarflexion    Ankle inversion    Ankle eversion     (Blank rows = not tested)   GAIT: Distance walked: 50 feet Assistive device utilized: Crutches Level of assistance: Complete Independence Comments: NA   TODAY'S TREATMENT:                                                                                                                              DATE: 09/21/2023 Quadriceps sets 2 sets of 5 with heel prop for 5 seconds Tailgate knee flexion 1 minutes AAROM left knee flexion 10 x 10 seconds Seated knee extension stretch with foot in chair for 3 minutes  Vaso left knee 10 minutes Medium Pressure 34*  Reviewed exam findings, HEP, importance on returning 0 - 115 degrees of AROM, reducing edema and improving quadriceps strength early in physical therapy.   PATIENT EDUCATION:  Education details: See above Person educated: Patient Education method: Explanation, Demonstration, Tactile cues, Verbal cues, and Handouts Education comprehension: verbalized understanding, returned demonstration, verbal cues required, tactile cues required, and needs further education  HOME EXERCISE PROGRAM: Access Code: [email or web address] URL: [email or web address] Date: 09/21/2023 Prepared by: Pauletta Browns  Exercises - Long Sitting Quad Set with Towel Roll Under Heel  - 5 x daily - 7 x weekly - 2 sets - 10 reps - 5 secponds hold - Seated Knee Flexion AAROM  - 3-5 x daily - 7 x weekly - 1 sets - 1 reps - 3 minutes hold - Seated Passive Knee Extension  - 2-3 x daily - 7 x weekly - 1 sets - 1 reps  - 3-10 minutes hold  ASSESSMENT:  CLINICAL IMPRESSION: Patient is a 52 y.o. male who was seen today for physical therapy evaluation and treatment for  Diagnosis  Z96.652 (ICD-10-CM) - S/P total knee arthroplasty, left  .  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  AROM was 8 - 0 - 86 degrees today.  AROM (flexion and extension), quadriceps strength and edema control are the emphasis early in physical therapy.  OBJECTIVE IMPAIRMENTS: Abnormal gait, decreased activity tolerance, decreased balance, decreased endurance, decreased knowledge of condition, difficulty walking, decreased ROM, decreased strength, increased edema, impaired perceived functional ability, obesity, and pain.   ACTIVITY LIMITATIONS: carrying, bending, sitting, standing, squatting, sleeping, stairs, and locomotion level  PARTICIPATION LIMITATIONS: driving, community activity, occupation, and yard work  PERSONAL FACTORS: HTN, HLD, Lt shoulder scope, Lt TKA 09/06/2023, obesity are also affecting patient's functional outcome.   REHAB POTENTIAL: Good  CLINICAL DECISION MAKING: Stable/uncomplicated  EVALUATION COMPLEXITY: Low   GOALS: Goals reviewed with patient? Yes  SHORT TERM GOALS: Target date: 10/19/2023 Bradley Burns will be independent with his day 1 HEP Baseline: Started 09/21/2023 Goal status: INITIAL  2.  Improve left knee AROM to 5 - 0 - 105 degrees Baseline: 8 - 0 - 86 degrees Goal status: INITIAL   LONG TERM GOALS: Target date: 11/15/2022  Improve FOTO to 56 in 16 visits Baseline: 36 Goal status: INITIAL  2.  Bradley Burns will report left knee pain consistently 0-4/10 on the Numeric Pain Rating Scale Baseline: 2-9/10 Goal status: INITIAL  3.  Improve left knee AROM to 2 - 0 - 115 degrees Baseline: 8 - 0 - 86 degrees Goal status: INITIAL  4.  Bradley Burns will have improved quadriceps strength as assessed by assistive device free gait  without increasing edema Baseline: Bilateral axillary crutches, walker or cane required Goal status: INITIAL  5.  Bradley Burns will be independent with his long-term HEP at DC Baseline: Started 09/21/2023 Goal status: INITIAL  PLAN:  PT FREQUENCY: 1-2x/week  PT DURATION: 8 weeks  PLANNED INTERVENTIONS: 97110-Therapeutic exercises, 97530- Therapeutic activity, 97112- Neuromuscular re-education, 97535- Self Care, 33295- Manual therapy, [phone]- Gait training, [phone]- Vasopneumatic device, Patient/Family education, Balance training, Stair training, Joint mobilization, and Cryotherapy  PLAN FOR NEXT SESSION: Total knee protocol   Cherlyn Cushing, PT, MPT 09/21/2023, 2:27 PM

## 2023-09-23 ENCOUNTER — Encounter: Payer: Self-pay | Admitting: Rehabilitative and Restorative Service Providers"

## 2023-09-23 ENCOUNTER — Ambulatory Visit (INDEPENDENT_AMBULATORY_CARE_PROVIDER_SITE_OTHER): Payer: BC Managed Care – PPO | Admitting: Rehabilitative and Restorative Service Providers"

## 2023-09-23 DIAGNOSIS — R262 Difficulty in walking, not elsewhere classified: Secondary | ICD-10-CM

## 2023-09-23 DIAGNOSIS — R600 Localized edema: Secondary | ICD-10-CM

## 2023-09-23 DIAGNOSIS — M6281 Muscle weakness (generalized): Secondary | ICD-10-CM

## 2023-09-23 DIAGNOSIS — M25662 Stiffness of left knee, not elsewhere classified: Secondary | ICD-10-CM | POA: Diagnosis not present

## 2023-09-23 DIAGNOSIS — M25562 Pain in left knee: Secondary | ICD-10-CM

## 2023-09-23 NOTE — Therapy (Signed)
OUTPATIENT PHYSICAL THERAPY LOWER EXTREMITY TREATMENT   Patient Name: Bradley Burns MRN: [id number] DOB:[birth year], 52 y.o., male Today's Date: 09/23/2023  END OF SESSION:  PT End of Session - 09/23/23 1019     Visit Number 2    Number of Visits 16    Date for PT Re-Evaluation 11/16/23    Authorization Type BCBS    Authorization - Number of Visits 60    PT Start Time 1018    PT Stop Time 1110    PT Time Calculation (min) 52 min    Activity Tolerance Patient tolerated treatment well;No increased pain;Patient limited by pain    Behavior During Therapy [facility] for tasks assessed/performed              Past Medical History:  Diagnosis Date   Allergy    Hypertension    Past Surgical History:  Procedure Laterality Date   ANKLE ARTHROSCOPY Bilateral    APPENDECTOMY     KNEE ARTHROSCOPY Bilateral    NASAL SEPTUM SURGERY     SHOULDER ARTHROSCOPY Left    TOTAL KNEE ARTHROPLASTY Left 09/06/2023   Procedure: LEFT TOTAL KNEE ARTHROPLASTY;  Surgeon: Cammy Copa, MD;  Location: [facility] OR;  Service: Orthopedics;  Laterality: Left;  RNFA APRIL PLEASE   Patient Active Problem List   Diagnosis Date Noted   OA (osteoarthritis) of knee 09/06/2023   Arthritis of knee 09/06/2023   Hyperlipidemia 09/08/2019   Vitamin D deficiency 06/03/2017   Hypertension 05/25/2017   Seasonal allergies 05/25/2017   History of smoking for 2-5 years 05/25/2017   Social alcohol use 05/25/2017   Obesity (BMI 30-39.9) 05/25/2017   S/P appendectomy 05/25/2017   GERD (gastroesophageal reflux disease) 05/25/2017    PCP: NA  REFERRING PROVIDER: Julieanne Cotton, PA-C  REFERRING DIAG:  Diagnosis  [phone] (ICD-10-CM) - S/P total knee arthroplasty, left    THERAPY DIAG:  Difficulty in walking, not elsewhere classified  Localized edema  Muscle weakness (generalized)  Stiffness of left knee, not elsewhere classified  Left knee pain, unspecified chronicity  Rationale for Evaluation and  Treatment: Rehabilitation  ONSET DATE: 09/06/2023 TKA  SUBJECTIVE:   SUBJECTIVE STATEMENT: Bradley Burns notes good HEP compliance.  Sleep is interrupted, although he got 6 hours uninterrupted last night.  PERTINENT HISTORY: HTN, HLD, Lt shoulder scope, Lt TKA 09/06/2023, obesity   PAIN:  Are you having pain? Yes: NPRS scale: 2-9/10 Pain location: Lt > Rt knee Pain description: Ache, sharp and throbbing at times Aggravating factors: Prolonged postures, particularly WB Relieving factors: Ice, meds as needed  PRECAUTIONS: None  RED FLAGS: None   WEIGHT BEARING RESTRICTIONS: No  FALLS:  Has patient fallen in last 6 months? No  LIVING ENVIRONMENT: Lives with: lives with their family and lives with their spouse Lives in: Mobile home Stairs:  OK with crutches and handrail Has following equipment at home: Single point cane, Environmental consultant - 2 wheeled, and Crutches  OCCUPATION: Truck driver, out of work presently  PLOF: Independent  PATIENT GOALS: Return to work, working on cars, Catering manager...  NEXT MD VISIT: 10/17/2023  OBJECTIVE:  Note: Objective measures were completed at Evaluation unless otherwise noted.  DIAGNOSTIC FINDINGS: AP and lateral views of the knee reviewed.  Total knee arthroplasty  prosthesis in good position and alignment without any complicating  features.  There is no evidence of dislocation, periprosthetic fracture,  patella baja/alta  PATIENT SURVEYS:  FOTO 36 (Goal 56 in 16 visits)  COGNITION: Overall cognitive status: Within functional limits for  tasks assessed     SENSATION: No tingling or numbness  EDEMA:   Noted and not objectively assessed   LOWER EXTREMITY ROM:  Active ROM Left/Right 09/21/2023 Left 09/23/2023  Hip flexion    Hip extension    Hip abduction    Hip adduction    Hip internal rotation    Hip external rotation    Knee flexion 86/142 101  Knee extension -8/-2   Ankle dorsiflexion    Ankle plantarflexion    Ankle inversion    Ankle  eversion     (Blank rows = not tested)  LOWER EXTREMITY STRENGTH: Deferred at evaluation  MMT Left/Right 09/21/2023   Hip flexion    Hip extension    Hip abduction    Hip adduction    Hip internal rotation    Hip external rotation    Knee flexion    Knee extension    Ankle dorsiflexion    Ankle plantarflexion    Ankle inversion    Ankle eversion     (Blank rows = not tested)   GAIT: Distance walked: 50 feet Assistive device utilized: Crutches Level of assistance: Complete Independence Comments: NA   TODAY'S TREATMENT:                                                                                                                              DATE:  09/23/2023 Recumbent bike AAROM to full range Seat 9 for 5 minutes, Level 6-10 Quadriceps sets 2 sets of 10 with heel prop for 5 seconds Tailgate knee flexion 1 minutes AAROM left knee flexion 10 x 10 seconds Seated knee extension stretch with foot in chair for 3 minutes Prone quadriceps stretch left only 4 x 20 seconds  Functional Activities: Double Leg Press 75# 15 x full extension to full flexion with 3 second hold at each extreme Single Leg Press 37# 10 x full extension to full flexion with 3 second hold at each extreme  Vaso left knee 10 minutes Medium Pressure 34*   09/21/2023 Quadriceps sets 2 sets of 5 with heel prop for 5 seconds Tailgate knee flexion 1 minutes AAROM left knee flexion 10 x 10 seconds Seated knee extension stretch with foot in chair for 3 minutes  Vaso left knee 10 minutes Medium Pressure 34*  Reviewed exam findings, HEP, importance on returning 0 - 115 degrees of AROM, reducing edema and improving quadriceps strength early in physical therapy.   PATIENT EDUCATION:  Education details: See above Person educated: Patient Education method: Explanation, Demonstration, Tactile cues, Verbal cues, and Handouts Education comprehension: verbalized understanding, returned demonstration, verbal cues  required, tactile cues required, and needs further education  HOME EXERCISE PROGRAM: Access Code: [email or web address] URL: [email or web address] Date: 09/21/2023 Prepared by: Pauletta Browns  Exercises - Long Sitting Quad Set with Towel Roll Under Heel  - 5 x daily - 7 x weekly - 2 sets - 10 reps - 5 secponds hold - Seated Knee Flexion  AAROM  - 3-5 x daily - 7 x weekly - 1 sets - 1 reps - 3 minutes hold - Seated Passive Knee Extension  - 2-3 x daily - 7 x weekly - 1 sets - 1 reps - 3-10 minutes hold  ASSESSMENT:  CLINICAL IMPRESSION: AROM is 6 - 0 - 101 degrees today (was 8 - 0 - 86 degrees).  AROM (flexion and extension), quadriceps strength and edema control remain the emphasis early in physical therapy.  Increase quadriceps strength and balance/gait challenges as AROM continues to improve.  OBJECTIVE IMPAIRMENTS: Abnormal gait, decreased activity tolerance, decreased balance, decreased endurance, decreased knowledge of condition, difficulty walking, decreased ROM, decreased strength, increased edema, impaired perceived functional ability, obesity, and pain.   ACTIVITY LIMITATIONS: carrying, bending, sitting, standing, squatting, sleeping, stairs, and locomotion level  PARTICIPATION LIMITATIONS: driving, community activity, occupation, and yard work  PERSONAL FACTORS: HTN, HLD, Lt shoulder scope, Lt TKA 09/06/2023, obesity are also affecting patient's functional outcome.   REHAB POTENTIAL: Good  CLINICAL DECISION MAKING: Stable/uncomplicated  EVALUATION COMPLEXITY: Low   GOALS: Goals reviewed with patient? Yes  SHORT TERM GOALS: Target date: 10/19/2023 Bradley Burns will be independent with his day 1 HEP Baseline: Started 09/21/2023 Goal status: Met 09/23/2023  2.  Improve left knee AROM to 5 - 0 - 105 degrees Baseline: 8 - 0 - 86 degrees Goal status: On Going 11/22/204   LONG TERM GOALS: Target date: 11/15/2022  Improve FOTO to 56 in 16 visits Baseline: 36 Goal status:  INITIAL  2.  Bradley Burns will report left knee pain consistently 0-4/10 on the Numeric Pain Rating Scale Baseline: 2-9/10 Goal status: INITIAL  3.  Improve left knee AROM to 2 - 0 - 115 degrees Baseline: 8 - 0 - 86 degrees Goal status: INITIAL  4.  Bradley Burns will have improved quadriceps strength as assessed by assistive device free gait without increasing edema Baseline: Bilateral axillary crutches, walker or cane required Goal status: INITIAL  5.  Bradley Burns will be independent with his long-term HEP at DC Baseline: Started 09/21/2023 Goal status: INITIAL  PLAN:  PT FREQUENCY: 1-2x/week  PT DURATION: 8 weeks  PLANNED INTERVENTIONS: 97110-Therapeutic exercises, 97530- Therapeutic activity, 97112- Neuromuscular re-education, 97535- Self Care, 16109- Manual therapy, [phone]- Gait training, [phone]- Vasopneumatic device, Patient/Family education, Balance training, Stair training, Joint mobilization, and Cryotherapy  PLAN FOR NEXT SESSION: Total knee protocol   Cherlyn Cushing, PT, MPT 09/23/2023, 11:01 AM

## 2023-09-26 ENCOUNTER — Telehealth: Payer: Self-pay | Admitting: Orthopedic Surgery

## 2023-09-26 NOTE — Telephone Encounter (Signed)
Pt requesting Oxycodone refill, pt brother had already picked up tramadol before it was cancelled and pt has been taking that also in between taking the Oxycodone being he may not need the full strength of the oxy every time , pt would like to know if this is okay. Pt will not have any more Oxy by the weekend would like refilled before the holiday.

## 2023-09-27 ENCOUNTER — Encounter: Payer: BC Managed Care – PPO | Admitting: Physical Therapy

## 2023-09-27 ENCOUNTER — Other Ambulatory Visit: Payer: Self-pay | Admitting: Orthopedic Surgery

## 2023-09-27 MED ORDER — TRAMADOL HCL 50 MG PO TABS: 50.0000 mg | ORAL_TABLET | Freq: Four times a day (QID) | ORAL | 0 refills | Status: AC

## 2023-09-27 MED ORDER — OXYCODONE HCL 5 MG PO TABS: 5.0000 mg | ORAL_TABLET | Freq: Four times a day (QID) | ORAL | 0 refills | Status: AC | PRN

## 2023-09-27 NOTE — Telephone Encounter (Signed)
Dr August Saucer called and spoke with patient today, addressed these concerns

## 2023-10-04 ENCOUNTER — Ambulatory Visit (INDEPENDENT_AMBULATORY_CARE_PROVIDER_SITE_OTHER): Payer: BC Managed Care – PPO | Admitting: Physical Therapy

## 2023-10-04 ENCOUNTER — Encounter: Payer: Self-pay | Admitting: Physical Therapy

## 2023-10-04 DIAGNOSIS — M25662 Stiffness of left knee, not elsewhere classified: Secondary | ICD-10-CM

## 2023-10-04 DIAGNOSIS — R262 Difficulty in walking, not elsewhere classified: Secondary | ICD-10-CM | POA: Diagnosis not present

## 2023-10-04 DIAGNOSIS — M6281 Muscle weakness (generalized): Secondary | ICD-10-CM

## 2023-10-04 DIAGNOSIS — R600 Localized edema: Secondary | ICD-10-CM | POA: Diagnosis not present

## 2023-10-04 DIAGNOSIS — M25562 Pain in left knee: Secondary | ICD-10-CM

## 2023-10-04 NOTE — Therapy (Signed)
OUTPATIENT PHYSICAL THERAPY LOWER EXTREMITY TREATMENT   Patient Name: Bradley Burns MRN: [id number] DOB:1970-11-04, 52 y.o., male Today's Date: 10/04/2023  END OF SESSION:  PT End of Session - 10/04/23 1352     Visit Number 3    Number of Visits 16    Date for PT Re-Evaluation 11/16/23    Authorization Type BCBS    Authorization - Number of Visits 60    PT Start Time 1347    PT Stop Time 1440    PT Time Calculation (min) 53 min    Activity Tolerance Patient tolerated treatment well;No increased pain;Patient limited by pain    Behavior During Therapy [facility] for tasks assessed/performed               Past Medical History:  Diagnosis Date   Allergy    Hypertension    Past Surgical History:  Procedure Laterality Date   ANKLE ARTHROSCOPY Bilateral    APPENDECTOMY     KNEE ARTHROSCOPY Bilateral    NASAL SEPTUM SURGERY     SHOULDER ARTHROSCOPY Left    TOTAL KNEE ARTHROPLASTY Left 09/06/2023   Procedure: LEFT TOTAL KNEE ARTHROPLASTY;  Surgeon: Cammy Copa, MD;  Location: [facility] OR;  Service: Orthopedics;  Laterality: Left;  RNFA APRIL PLEASE   Patient Active Problem List   Diagnosis Date Noted   OA (osteoarthritis) of knee 09/06/2023   Arthritis of left knee 09/06/2023   Hyperlipidemia 09/08/2019   Vitamin D deficiency 06/03/2017   Hypertension 05/25/2017   Seasonal allergies 05/25/2017   History of smoking for 2-5 years 05/25/2017   Social alcohol use 05/25/2017   Obesity (BMI 30-39.9) 05/25/2017   S/P appendectomy 05/25/2017   GERD (gastroesophageal reflux disease) 05/25/2017    PCP: NA  REFERRING PROVIDER: Julieanne Cotton, PA-C  REFERRING DIAG:  Diagnosis  [phone] (ICD-10-CM) - S/P total knee arthroplasty, left    THERAPY DIAG:  Difficulty in walking, not elsewhere classified  Localized edema  Muscle weakness (generalized)  Stiffness of left knee, not elsewhere classified  Left knee pain, unspecified chronicity  Rationale for Evaluation  and Treatment: Rehabilitation  ONSET DATE: 09/06/2023 TKA  SUBJECTIVE:   SUBJECTIVE STATEMENT: He has been doing his exercises.  He is having issues with sleeping.    PERTINENT HISTORY: HTN, HLD, Lt shoulder scope, Lt TKA 09/06/2023, obesity   PAIN:  Are you having pain?  Yes: NPRS scale: today 3/10 and in last week  0/10 - 8/10 Pain location: Lt > Rt knee Pain description: Ache, sharp and throbbing at times Aggravating factors: Prolonged postures, particularly WB Relieving factors: Ice, meds as needed  PRECAUTIONS: None  RED FLAGS: None   WEIGHT BEARING RESTRICTIONS: No  FALLS:  Has patient fallen in last 6 months? No  LIVING ENVIRONMENT: Lives with: lives with their family and lives with their spouse Lives in: Mobile home Stairs:  OK with crutches and handrail Has following equipment at home: Single point cane, Environmental consultant - 2 wheeled, and Crutches  OCCUPATION: Truck driver, out of work presently  PLOF: Independent  PATIENT GOALS: Return to work, working on cars, Catering manager...  NEXT MD VISIT: 10/17/2023  OBJECTIVE:  Note: Objective measures were completed at Evaluation unless otherwise noted.  DIAGNOSTIC FINDINGS: AP and lateral views of the knee reviewed.  Total knee arthroplasty  prosthesis in good position and alignment without any complicating  features.  There is no evidence of dislocation, periprosthetic fracture,  patella baja/alta  PATIENT SURVEYS:  FOTO 36 (Goal 56 in 16  visits)  COGNITION: Overall cognitive status: Within functional limits for tasks assessed     SENSATION: No tingling or numbness  EDEMA:  Noted and not objectively assessed  LOWER EXTREMITY ROM:  Active ROM Left/Right  09/21/2023 Left  09/23/23  Hip flexion    Hip extension    Hip abduction    Hip adduction    Hip internal rotation    Hip external rotation    Knee flexion 86/142 101  Knee extension -8/-2   Ankle dorsiflexion    Ankle plantarflexion    Ankle inversion     Ankle eversion     (Blank rows = not tested)  LOWER EXTREMITY STRENGTH: Deferred at evaluation  MMT Left/Right  09/21/2023  Hip flexion   Hip extension   Hip abduction   Hip adduction   Hip internal rotation   Hip external rotation   Knee flexion   Knee extension   Ankle dorsiflexion   Ankle plantarflexion   Ankle inversion   Ankle eversion    (Blank rows = not tested)   GAIT: Distance walked: 50 feet Assistive device utilized: Crutches Level of assistance: Complete Independence Comments: NA  TODAY'S TREATMENT:                                                                                                                              DATE:  10/04/2023 Therapeutic Exercise: Precor recumbent bike seat 8 level 1 for 8 min.  Sitting with LLE propped on chair 1 min ext with quad sets, tactile cues / tapping for VMO firing. Then knee flexion for 1 min with left foot at edge of chair against wall. 2nd set of 1 min ext with quad sets.  Gastroc stretch on incline board 30 sec hold 3 reps Heel raises without UE assist 10 reps 2 sets. Supine heel slide with strap & 18" ball with flexion stretch 5 sec and ext / leg press 5 sec 10 reps Standing TKE blue theraband 10 reps 1st set TKE only; 2nd set TKE & SLS with chair back close for safety.   Therapeutic Activities: Double Leg Press 100# 15 x full extension to full flexion with 3 second hold at each extreme Single Leg Press 43# BLEs for 10 reps 2 sets full extension to full flexion with 3 second hold at each extreme   Vaso 34* medium compression left knee 10 min with elevation.    TREATMENT:  DATE:  09/23/2023 Recumbent bike AAROM to full range Seat 9 for 5 minutes, Level 6-10 Quadriceps sets 2 sets of 10 with heel prop for 5 seconds Tailgate knee flexion 1 minutes AAROM left knee flexion 10 x 10  seconds Seated knee extension stretch with foot in chair for 3 minutes Prone quadriceps stretch left only 4 x 20 seconds  Functional Activities: Double Leg Press 75# 15 x full extension to full flexion with 3 second hold at each extreme Single Leg Press 37# 10 x full extension to full flexion with 3 second hold at each extreme  Vaso left knee 10 minutes Medium Pressure 34*   09/21/2023 Quadriceps sets 2 sets of 5 with heel prop for 5 seconds Tailgate knee flexion 1 minutes AAROM left knee flexion 10 x 10 seconds Seated knee extension stretch with foot in chair for 3 minutes  Vaso left knee 10 minutes Medium Pressure 34*  Reviewed exam findings, HEP, importance on returning 0 - 115 degrees of AROM, reducing edema and improving quadriceps strength early in physical therapy.   PATIENT EDUCATION:  Education details: See above Person educated: Patient Education method: Explanation, Demonstration, Tactile cues, Verbal cues, and Handouts Education comprehension: verbalized understanding, returned demonstration, verbal cues required, tactile cues required, and needs further education  HOME EXERCISE PROGRAM: Access Code: [email or web address] URL: [email or web address] Date: 09/21/2023 Prepared by: Pauletta Browns  Exercises - Long Sitting Quad Set with Towel Roll Under Heel  - 5 x daily - 7 x weekly - 2 sets - 10 reps - 5 secponds hold - Seated Knee Flexion AAROM  - 3-5 x daily - 7 x weekly - 1 sets - 1 reps - 3 minutes hold - Seated Passive Knee Extension  - 2-3 x daily - 7 x weekly - 1 sets - 1 reps - 3-10 minutes hold  ASSESSMENT:  CLINICAL IMPRESSION: Patient has to weight shift or load left knee upon arising for >5 reps prior to initiating gait.  Patient improved knee control with functional exercises.  Pt continues to benefit from skilled care.   OBJECTIVE IMPAIRMENTS: Abnormal gait, decreased activity tolerance, decreased balance, decreased endurance, decreased knowledge of  condition, difficulty walking, decreased ROM, decreased strength, increased edema, impaired perceived functional ability, obesity, and pain.   ACTIVITY LIMITATIONS: carrying, bending, sitting, standing, squatting, sleeping, stairs, and locomotion level  PARTICIPATION LIMITATIONS: driving, community activity, occupation, and yard work  PERSONAL FACTORS: HTN, HLD, Lt shoulder scope, Lt TKA 09/06/2023, obesity are also affecting patient's functional outcome.   REHAB POTENTIAL: Good  CLINICAL DECISION MAKING: Stable/uncomplicated  EVALUATION COMPLEXITY: Low   GOALS: Goals reviewed with patient? Yes  SHORT TERM GOALS: Target date: 10/19/2023 [name] will be independent with his day 1 HEP Baseline: Started 09/21/2023 Goal status: Met 09/23/2023  2.  Improve left knee AROM to 5 - 0 - 105 degrees Baseline: 8 - 0 - 86 degrees Goal status: On Going 10/04/2023   LONG TERM GOALS: Target date: 11/15/2022  Improve FOTO to 56 in 16 visits Baseline: 36 Goal status: INITIAL  2.  [name] will report left knee pain consistently 0-4/10 on the Numeric Pain Rating Scale Baseline: 2-9/10 Goal status: INITIAL  3.  Improve left knee AROM to 2 - 0 - 115 degrees Baseline: 8 - 0 - 86 degrees Goal status: INITIAL  4.  [name] will have improved quadriceps strength as assessed by assistive device free gait without increasing edema Baseline: Bilateral axillary crutches, walker or cane required Goal status: INITIAL  5.  [name]  will be independent with his long-term HEP at DC Baseline: Started 09/21/2023 Goal status: INITIAL  PLAN:  PT FREQUENCY: 1-2x/week  PT DURATION: 8 weeks  PLANNED INTERVENTIONS: 97110-Therapeutic exercises, 97530- Therapeutic activity, 97112- Neuromuscular re-education, 97535- Self Care, 09604- Manual therapy, [phone]- Gait training, [phone]- Vasopneumatic device, Patient/Family education, Balance training, Stair training, Joint mobilization, and Cryotherapy  PLAN FOR NEXT SESSION: ROM  measurements & HH dynameter strength,  progressive functional strengthening exercises.  Stairs utilizing left knee.  Vaso to end.    Vladimir Faster, PT, DPT 10/04/2023, 4:41 PM

## 2023-10-06 ENCOUNTER — Encounter: Payer: Self-pay | Admitting: Physical Therapy

## 2023-10-06 ENCOUNTER — Ambulatory Visit (INDEPENDENT_AMBULATORY_CARE_PROVIDER_SITE_OTHER): Payer: BC Managed Care – PPO | Admitting: Physical Therapy

## 2023-10-06 DIAGNOSIS — R600 Localized edema: Secondary | ICD-10-CM | POA: Diagnosis not present

## 2023-10-06 DIAGNOSIS — M6281 Muscle weakness (generalized): Secondary | ICD-10-CM

## 2023-10-06 DIAGNOSIS — R262 Difficulty in walking, not elsewhere classified: Secondary | ICD-10-CM

## 2023-10-06 DIAGNOSIS — M25662 Stiffness of left knee, not elsewhere classified: Secondary | ICD-10-CM

## 2023-10-06 DIAGNOSIS — M25562 Pain in left knee: Secondary | ICD-10-CM

## 2023-10-06 NOTE — Therapy (Signed)
OUTPATIENT PHYSICAL THERAPY LOWER EXTREMITY TREATMENT   Patient Name: Bradley Burns MRN: [id number] DOB:05/16/1971, 52 y.o., male Today's Date: 10/06/2023  END OF SESSION:  PT End of Session - 10/06/23 1308     Visit Number 4    Number of Visits 16    Date for PT Re-Evaluation 11/16/23    Authorization Type BCBS    Authorization - Number of Visits 60    PT Start Time 1300    PT Stop Time 1348    PT Time Calculation (min) 48 min    Activity Tolerance Patient tolerated treatment well    Behavior During Therapy WFL for tasks assessed/performed               Past Medical History:  Diagnosis Date   Allergy    Hypertension    Past Surgical History:  Procedure Laterality Date   ANKLE ARTHROSCOPY Bilateral    APPENDECTOMY     KNEE ARTHROSCOPY Bilateral    NASAL SEPTUM SURGERY     SHOULDER ARTHROSCOPY Left    TOTAL KNEE ARTHROPLASTY Left 09/06/2023   Procedure: LEFT TOTAL KNEE ARTHROPLASTY;  Surgeon: Cammy Copa, MD;  Location: [facility] OR;  Service: Orthopedics;  Laterality: Left;  RNFA APRIL PLEASE   Patient Active Problem List   Diagnosis Date Noted   OA (osteoarthritis) of knee 09/06/2023   Arthritis of left knee 09/06/2023   Hyperlipidemia 09/08/2019   Vitamin D deficiency 06/03/2017   Hypertension 05/25/2017   Seasonal allergies 05/25/2017   History of smoking for 2-5 years 05/25/2017   Social alcohol use 05/25/2017   Obesity (BMI 30-39.9) 05/25/2017   S/P appendectomy 05/25/2017   GERD (gastroesophageal reflux disease) 05/25/2017    PCP: NA  REFERRING PROVIDER: Julieanne Cotton, PA-C  REFERRING DIAG:  Diagnosis  [phone] (ICD-10-CM) - S/P total knee arthroplasty, left    THERAPY DIAG:  Difficulty in walking, not elsewhere classified  Localized edema  Muscle weakness (generalized)  Stiffness of left knee, not elsewhere classified  Left knee pain, unspecified chronicity  Rationale for Evaluation and Treatment: Rehabilitation  ONSET  DATE: 09/06/2023 TKA  SUBJECTIVE:   SUBJECTIVE STATEMENT: He took pain medicine before coming so is not having much pain at the moment   PERTINENT HISTORY: HTN, HLD, Lt shoulder scope, Lt TKA 09/06/2023, obesity   PAIN:  Are you having pain?  Yes: NPRS scale: today 3/10 and in last week  0/10 - 8/10 Pain location: Lt > Rt knee Pain description: Ache, sharp and throbbing at times Aggravating factors: Prolonged postures, particularly WB Relieving factors: Ice, meds as needed  PRECAUTIONS: None  RED FLAGS: None   WEIGHT BEARING RESTRICTIONS: No  FALLS:  Has patient fallen in last 6 months? No  LIVING ENVIRONMENT: Lives with: lives with their family and lives with their spouse Lives in: Mobile home Stairs:  OK with crutches and handrail Has following equipment at home: Single point cane, Environmental consultant - 2 wheeled, and Crutches  OCCUPATION: Truck driver, out of work presently  PLOF: Independent  PATIENT GOALS: Return to work, working on cars, Catering manager...  NEXT MD VISIT: 10/17/2023  OBJECTIVE:  Note: Objective measures were completed at Evaluation unless otherwise noted.  DIAGNOSTIC FINDINGS: AP and lateral views of the knee reviewed.  Total knee arthroplasty  prosthesis in good position and alignment without any complicating  features.  There is no evidence of dislocation, periprosthetic fracture,  patella baja/alta  PATIENT SURVEYS:  FOTO 36 (Goal 56 in 16 visits)  COGNITION: Overall  cognitive status: Within functional limits for tasks assessed     SENSATION: No tingling or numbness  EDEMA:  Noted and not objectively assessed  LOWER EXTREMITY ROM:  Active ROM Left/Right  09/21/2023 Left  09/23/23  Hip flexion    Hip extension    Hip abduction    Hip adduction    Hip internal rotation    Hip external rotation    Knee flexion 86/142 101  Knee extension -8/-2   Ankle dorsiflexion    Ankle plantarflexion    Ankle inversion    Ankle eversion     (Blank rows =  not tested)  LOWER EXTREMITY STRENGTH: Deferred at evaluation  MMT Left/Right  09/21/2023  Hip flexion   Hip extension   Hip abduction   Hip adduction   Hip internal rotation   Hip external rotation   Knee flexion   Knee extension   Ankle dorsiflexion   Ankle plantarflexion   Ankle inversion   Ankle eversion    (Blank rows = not tested)   GAIT: Distance walked: 50 feet Assistive device utilized: Crutches Level of assistance: Complete Independence Comments: NA  TODAY'S TREATMENT:                                                                                                                              DATE:  10/06/2023 Therapeutic Exercise: Precor recumbent bike seat 8 level 3 for 8 min.   Gastroc stretch on incline board 30 sec hold 3 reps. Supine heel slide with strap & 18" ball with flexion stretch 5 sec and ext / leg press 5 sec 10 reps Supine heel prop with quad sets 5 sec X20  Therapeutic Activities: Double Leg Press 100# 15 x2  full extension to full flexion with 3 second hold at each extreme Single Leg Press 50# BLEs for 15 reps 2 sets full extension to full flexion with 3 second hold at each extreme Step ups 6 inch step X 10 forward, X 10 lateral leading with left LE using one UE support  Vaso 34* medium compression left knee 10 min with elevation.   10/04/2023 Therapeutic Exercise: Precor recumbent bike seat 8 level 1 for 8 min.  Sitting with LLE propped on chair 1 min ext with quad sets, tactile cues / tapping for VMO firing. Then knee flexion for 1 min with left foot at edge of chair against wall. 2nd set of 1 min ext with quad sets.  Gastroc stretch on incline board 30 sec hold 3 reps Heel raises without UE assist 10 reps 2 sets. Supine heel slide with strap & 18" ball with flexion stretch 5 sec and ext / leg press 5 sec 10 reps Standing TKE blue theraband 10 reps 1st set TKE only; 2nd set TKE & SLS with chair back close for safety.   Therapeutic  Activities: Double Leg Press 100# 15 x full extension to full flexion with 3 second hold at each extreme  Single Leg Press 43# BLEs for 10 reps 2 sets full extension to full flexion with 3 second hold at each extreme   Vaso 34* medium compression left knee 10 min with elevation.    TREATMENT:                                                                                                                              DATE:  09/23/2023 Recumbent bike AAROM to full range Seat 9 for 5 minutes, Level 6-10 Quadriceps sets 2 sets of 10 with heel prop for 5 seconds Tailgate knee flexion 1 minutes AAROM left knee flexion 10 x 10 seconds Seated knee extension stretch with foot in chair for 3 minutes Prone quadriceps stretch left only 4 x 20 seconds  Functional Activities: Double Leg Press 75# 15 x full extension to full flexion with 3 second hold at each extreme Single Leg Press 37# 10 x full extension to full flexion with 3 second hold at each extreme  Vaso left knee 10 minutes Medium Pressure 34*   09/21/2023 Quadriceps sets 2 sets of 5 with heel prop for 5 seconds Tailgate knee flexion 1 minutes AAROM left knee flexion 10 x 10 seconds Seated knee extension stretch with foot in chair for 3 minutes  Vaso left knee 10 minutes Medium Pressure 34*  Reviewed exam findings, HEP, importance on returning 0 - 115 degrees of AROM, reducing edema and improving quadriceps strength early in physical therapy.   PATIENT EDUCATION:  Education details: See above Person educated: Patient Education method: Explanation, Demonstration, Tactile cues, Verbal cues, and Handouts Education comprehension: verbalized understanding, returned demonstration, verbal cues required, tactile cues required, and needs further education  HOME EXERCISE PROGRAM: Access Code: [email or web address] URL: [email or web address] Date: 09/21/2023 Prepared by: Pauletta Browns  Exercises - Long Sitting Quad Set with Towel Roll  Under Heel  - 5 x daily - 7 x weekly - 2 sets - 10 reps - 5 secponds hold - Seated Knee Flexion AAROM  - 3-5 x daily - 7 x weekly - 1 sets - 1 reps - 3 minutes hold - Seated Passive Knee Extension  - 2-3 x daily - 7 x weekly - 1 sets - 1 reps - 3-10 minutes hold  ASSESSMENT:  CLINICAL IMPRESSION: We progressed his strength program today with good tolerance to this. His ROM is coming along well up to this point.    OBJECTIVE IMPAIRMENTS: Abnormal gait, decreased activity tolerance, decreased balance, decreased endurance, decreased knowledge of condition, difficulty walking, decreased ROM, decreased strength, increased edema, impaired perceived functional ability, obesity, and pain.   ACTIVITY LIMITATIONS: carrying, bending, sitting, standing, squatting, sleeping, stairs, and locomotion level  PARTICIPATION LIMITATIONS: driving, community activity, occupation, and yard work  PERSONAL FACTORS: HTN, HLD, Lt shoulder scope, Lt TKA 09/06/2023, obesity are also affecting patient's functional outcome.   REHAB POTENTIAL: Good  CLINICAL DECISION MAKING: Stable/uncomplicated  EVALUATION COMPLEXITY: Low   GOALS: Goals reviewed with patient? Yes  SHORT TERM GOALS: Target date: 10/19/2023 [name] will be independent with his day 1 HEP Baseline: Started 09/21/2023 Goal status: Met 09/23/2023  2.  Improve left knee AROM to 5 - 0 - 105 degrees Baseline: 8 - 0 - 86 degrees Goal status: On Going 10/04/2023   LONG TERM GOALS: Target date: 11/15/2022  Improve FOTO to 56 in 16 visits Baseline: 36 Goal status: INITIAL  2.  [name] will report left knee pain consistently 0-4/10 on the Numeric Pain Rating Scale Baseline: 2-9/10 Goal status: INITIAL  3.  Improve left knee AROM to 2 - 0 - 115 degrees Baseline: 8 - 0 - 86 degrees Goal status: INITIAL  4.  [name] will have improved quadriceps strength as assessed by assistive device free gait without increasing edema Baseline: Bilateral axillary crutches,  walker or cane required Goal status: INITIAL  5.  [name] will be independent with his long-term HEP at DC Baseline: Started 09/21/2023 Goal status: INITIAL  PLAN:  PT FREQUENCY: 1-2x/week  PT DURATION: 8 weeks  PLANNED INTERVENTIONS: 97110-Therapeutic exercises, 97530- Therapeutic activity, 97112- Neuromuscular re-education, 97535- Self Care, 29562- Manual therapy, [phone]- Gait training, [phone]- Vasopneumatic device, Patient/Family education, Balance training, Stair training, Joint mobilization, and Cryotherapy  PLAN FOR NEXT SESSION: ROM measurements,  progressive functional strengthening exercises.  Stairs utilizing left knee.  Vaso to end.    April Manson, PT, DPT 10/06/2023, 1:30 PM

## 2023-10-11 ENCOUNTER — Ambulatory Visit (INDEPENDENT_AMBULATORY_CARE_PROVIDER_SITE_OTHER): Payer: BC Managed Care – PPO | Admitting: Physical Therapy

## 2023-10-11 ENCOUNTER — Encounter: Payer: Self-pay | Admitting: Physical Therapy

## 2023-10-11 DIAGNOSIS — M25662 Stiffness of left knee, not elsewhere classified: Secondary | ICD-10-CM

## 2023-10-11 DIAGNOSIS — R600 Localized edema: Secondary | ICD-10-CM | POA: Diagnosis not present

## 2023-10-11 DIAGNOSIS — M6281 Muscle weakness (generalized): Secondary | ICD-10-CM | POA: Diagnosis not present

## 2023-10-11 DIAGNOSIS — R262 Difficulty in walking, not elsewhere classified: Secondary | ICD-10-CM

## 2023-10-11 DIAGNOSIS — M25562 Pain in left knee: Secondary | ICD-10-CM

## 2023-10-11 NOTE — Therapy (Signed)
OUTPATIENT PHYSICAL THERAPY LOWER EXTREMITY TREATMENT   Patient Name: Bradley Burns MRN: [id number] DOB:20-May-1971, 52 y.o., male Today's Date: 10/11/2023  END OF SESSION:  PT End of Session - 10/11/23 1425     Visit Number 5    Number of Visits 16    Date for PT Re-Evaluation 11/16/23    Authorization Type BCBS    Authorization - Number of Visits 60    PT Start Time 1350    PT Stop Time 1435    PT Time Calculation (min) 45 min    Activity Tolerance Patient tolerated treatment well    Behavior During Therapy WFL for tasks assessed/performed                Past Medical History:  Diagnosis Date   Allergy    Hypertension    Past Surgical History:  Procedure Laterality Date   ANKLE ARTHROSCOPY Bilateral    APPENDECTOMY     KNEE ARTHROSCOPY Bilateral    NASAL SEPTUM SURGERY     SHOULDER ARTHROSCOPY Left    TOTAL KNEE ARTHROPLASTY Left 09/06/2023   Procedure: LEFT TOTAL KNEE ARTHROPLASTY;  Surgeon: Cammy Copa, MD;  Location: [facility] OR;  Service: Orthopedics;  Laterality: Left;  RNFA APRIL PLEASE   Patient Active Problem List   Diagnosis Date Noted   OA (osteoarthritis) of knee 09/06/2023   Arthritis of left knee 09/06/2023   Hyperlipidemia 09/08/2019   Vitamin D deficiency 06/03/2017   Hypertension 05/25/2017   Seasonal allergies 05/25/2017   History of smoking for 2-5 years 05/25/2017   Social alcohol use 05/25/2017   Obesity (BMI 30-39.9) 05/25/2017   S/P appendectomy 05/25/2017   GERD (gastroesophageal reflux disease) 05/25/2017    PCP: NA  REFERRING PROVIDER: Julieanne Cotton, PA-C  REFERRING DIAG:  Diagnosis  [phone] (ICD-10-CM) - S/P total knee arthroplasty, left    THERAPY DIAG:  Difficulty in walking, not elsewhere classified  Localized edema  Muscle weakness (generalized)  Stiffness of left knee, not elsewhere classified  Left knee pain, unspecified chronicity  Rationale for Evaluation and Treatment: Rehabilitation  ONSET  DATE: 09/06/2023 TKA  SUBJECTIVE:   SUBJECTIVE STATEMENT: Pt stating he has already worked out on his bike about 90 minutes.   PERTINENT HISTORY: HTN, HLD, Lt shoulder scope, Lt TKA 09/06/2023, obesity   PAIN:  Are you having pain?  4/10, can increase to 9/10 at night Pain location: Lt > Rt knee Pain description: Ache, sharp and throbbing at times Aggravating factors: Prolonged postures, particularly WB Relieving factors: Ice, meds as needed  PRECAUTIONS: None  RED FLAGS: None   WEIGHT BEARING RESTRICTIONS: No  FALLS:  Has patient fallen in last 6 months? No  LIVING ENVIRONMENT: Lives with: lives with their family and lives with their spouse Lives in: Mobile home Stairs:  OK with crutches and handrail Has following equipment at home: Single point cane, Environmental consultant - 2 wheeled, and Crutches  OCCUPATION: Truck driver, out of work presently  PLOF: Independent  PATIENT GOALS: Return to work, working on cars, Catering manager...  NEXT MD VISIT: 10/17/2023  OBJECTIVE:  Note: Objective measures were completed at Evaluation unless otherwise noted.  DIAGNOSTIC FINDINGS: AP and lateral views of the knee reviewed.  Total knee arthroplasty  prosthesis in good position and alignment without any complicating  features.  There is no evidence of dislocation, periprosthetic fracture,  patella baja/alta  PATIENT SURVEYS:  FOTO 36 (Goal 56 in 16 visits)  COGNITION: Overall cognitive status: Within functional limits for tasks  assessed     SENSATION: No tingling or numbness  EDEMA:  Noted and not objectively assessed  LOWER EXTREMITY ROM:  Active ROM Left/Right  09/21/2023 Left  09/23/23 Left 10/11/23  Hip flexion     Hip extension     Hip abduction     Hip adduction     Hip internal rotation     Hip external rotation     Knee flexion 86/142 101 A: 110 P: 115  Knee extension -8/-2  A: -5 P: 0  Ankle dorsiflexion     Ankle plantarflexion     Ankle inversion     Ankle eversion       (Blank rows = not tested)  LOWER EXTREMITY STRENGTH: Deferred at evaluation  MMT Left/Right  09/21/2023  Hip flexion   Hip extension   Hip abduction   Hip adduction   Hip internal rotation   Hip external rotation   Knee flexion   Knee extension   Ankle dorsiflexion   Ankle plantarflexion   Ankle inversion   Ankle eversion    (Blank rows = not tested)   GAIT: Distance walked: 50 feet Assistive device utilized: Crutches Level of assistance: Complete Independence Comments: NA  TODAY'S TREATMENT:                                                                                                                              DATE:  10/11/23 TherEx: Leg Press: bil LE 125 3 x 15 Leg Press: left LE 68# 3 x 15  Gastroc stretch on slant board: x 60 seconds  Soleus stretch on slant board: x 60 sec TRX  squats: 2 x 10  NMR Vectors 3 cones placed ant/lat, lat, post/lat c UE  Side stepping with green TB around thighs 20 feet x 4 Stepping over 9 inch hurdle x 6 (4 trips total) Single leg stance on Airex x 4  Modalities:  Vasopnuematic: 34 deg, high compression x 10 minutes       10/06/2023 Therapeutic Exercise: Precor recumbent bike seat 8 level 3 for 8 min.   Gastroc stretch on incline board 30 sec hold 3 reps. Supine heel slide with strap & 18" ball with flexion stretch 5 sec and ext / leg press 5 sec 10 reps Supine heel prop with quad sets 5 sec X20  Therapeutic Activities: Double Leg Press 100# 15 x2  full extension to full flexion with 3 second hold at each extreme Single Leg Press 50# BLEs for 15 reps 2 sets full extension to full flexion with 3 second hold at each extreme Step ups 6 inch step X 10 forward, X 10 lateral leading with left LE using one UE support  Vaso 34* medium compression left knee 10 min with elevation.   10/04/2023 Therapeutic Exercise: Precor recumbent bike seat 8 level 1 for 8 min.  Sitting with LLE propped on chair 1 min ext with quad sets,  tactile cues / tapping  for VMO firing. Then knee flexion for 1 min with left foot at edge of chair against wall. 2nd set of 1 min ext with quad sets.  Gastroc stretch on incline board 30 sec hold 3 reps Heel raises without UE assist 10 reps 2 sets. Supine heel slide with strap & 18" ball with flexion stretch 5 sec and ext / leg press 5 sec 10 reps Standing TKE blue theraband 10 reps 1st set TKE only; 2nd set TKE & SLS with chair back close for safety.   Therapeutic Activities: Double Leg Press 100# 15 x full extension to full flexion with 3 second hold at each extreme Single Leg Press 43# BLEs for 10 reps 2 sets full extension to full flexion with 3 second hold at each extreme   Vaso 34* medium compression left knee 10 min with elevation.        PATIENT EDUCATION:  Education details: See above Person educated: Patient Education method: Explanation, Demonstration, Tactile cues, Verbal cues, and Handouts Education comprehension: verbalized understanding, returned demonstration, verbal cues required, tactile cues required, and needs further education  HOME EXERCISE PROGRAM: Access Code: [email or web address] URL: [email or web address] Date: 09/21/2023 Prepared by: Pauletta Browns  Exercises - Long Sitting Quad Set with Towel Roll Under Heel  - 5 x daily - 7 x weekly - 2 sets - 10 reps - 5 secponds hold - Seated Knee Flexion AAROM  - 3-5 x daily - 7 x weekly - 1 sets - 1 reps - 3 minutes hold - Seated Passive Knee Extension  - 2-3 x daily - 7 x weekly - 1 sets - 1 reps - 3-10 minutes hold  ASSESSMENT:  CLINICAL IMPRESSION: Pt is tolerating exercises well. Pt was warned not to push his rehab at home. Pt reporting mild pain today during treatment session but reported taking pain meds prior to therapy.  Pt's ROM has improved since his initial evaluation. Pt has met his STG's and he progressing toward his LTG's  OBJECTIVE IMPAIRMENTS: Abnormal gait, decreased activity tolerance,  decreased balance, decreased endurance, decreased knowledge of condition, difficulty walking, decreased ROM, decreased strength, increased edema, impaired perceived functional ability, obesity, and pain.   ACTIVITY LIMITATIONS: carrying, bending, sitting, standing, squatting, sleeping, stairs, and locomotion level  PARTICIPATION LIMITATIONS: driving, community activity, occupation, and yard work  PERSONAL FACTORS: HTN, HLD, Lt shoulder scope, Lt TKA 09/06/2023, obesity are also affecting patient's functional outcome.   REHAB POTENTIAL: Good  CLINICAL DECISION MAKING: Stable/uncomplicated  EVALUATION COMPLEXITY: Low   GOALS: Goals reviewed with patient? Yes  SHORT TERM GOALS: Target date: 10/19/2023 [name] will be independent with his day 1 HEP Baseline: Started 09/21/2023 Goal status: Met 09/23/2023  2.  Improve left knee AROM to 5 - 0 - 105 degrees Baseline: 8 - 0 - 86 degrees Goal status: MET  10/11/2023   LONG TERM GOALS: Target date: 11/15/2022  Improve FOTO to 56 in 16 visits Baseline: 36 Goal status: INITIAL  2.  [name] will report left knee pain consistently 0-4/10 on the Numeric Pain Rating Scale Baseline: 2-9/10 Goal status: INITIAL  3.  Improve left knee AROM to 2 - 0 - 115 degrees Baseline: 8 - 0 - 86 degrees Goal status: INITIAL  4.  [name] will have improved quadriceps strength as assessed by assistive device free gait without increasing edema Baseline: Bilateral axillary crutches, walker or cane required Goal status: INITIAL  5.  [name] will be independent with his long-term HEP at DC Baseline: Started 09/21/2023 Goal status: INITIAL  PLAN:  PT FREQUENCY: 1-2x/week  PT DURATION: 8 weeks  PLANNED INTERVENTIONS: 97110-Therapeutic exercises, 97530- Therapeutic activity, [id number]- Neuromuscular re-education, 97535- Self Care, [id number]- Manual therapy, [phone]- Gait training, [phone]- Vasopneumatic device, Patient/Family education, Balance training, Stair training, Joint  mobilization, and Cryotherapy  PLAN FOR NEXT SESSION: ROM measurements,  progressive functional strengthening exercises.  Stairs utilizing left knee.  Vaso to end.    Sharmon Leyden, PT, MPT 10/11/2023, 2:27 PM

## 2023-10-12 NOTE — Discharge Summary (Signed)
Physician Discharge Summary      Patient ID: Bradley Burns MRN: [id number] DOB/AGE: 1971-08-12 52 y.o.  Admit date: 09/06/2023 Discharge date: 09/07/2023  Admission Diagnoses:  Principal Problem:   OA (osteoarthritis) of knee Active Problems:   Arthritis of left knee   Discharge Diagnoses:  Same  Surgeries: Procedure(s): LEFT TOTAL KNEE ARTHROPLASTY on 09/06/2023   Consultants:   Discharged Condition: Stable  Hospital Course: Bradley Burns is an 52 y.o. male who was admitted 09/06/2023 with a chief complaint of left knee pain, and found to have a diagnosis of OA (osteoarthritis) of knee.  They were brought to the operating room on 09/06/2023 and underwent the above named procedures.  Patient tolerated the procedure well.  Mobilize with physical therapy on postop day #1 and was discharged to home in good condition.  Pain was controlled.  Help patient physical therapy along with home health physical therapy initiated.  He will follow-up with [place] in 2 weeks for clinical recheck.  Aspirin for DVT prophylaxis.  Antibiotics given:  Anti-infectives (From admission, onward)    Start     Dose/Rate Route Frequency Ordered Stop   09/06/23 1330  ceFAZolin (ANCEF) IVPB 1 g/50 mL premix        1 g 100 mL/hr over 30 Minutes Intravenous Every 6 hours 09/06/23 1237 09/06/23 1906   09/06/23 0837  vancomycin (VANCOCIN) powder  Status:  Discontinued          As needed 09/06/23 0837 09/06/23 1040   09/06/23 0600  ceFAZolin (ANCEF) IVPB 2g/100 mL premix        2 g 200 mL/hr over 30 Minutes Intravenous On call to O.R. 09/06/23 [id number] 09/06/23 0745     .  Recent vital signs:  Vitals:   09/07/23 0734 09/07/23 1453  BP: (!) 134/112 (!) 146/88  Pulse: 66 71  Resp: 17 18  Temp: 97.8 F (36.6 C) 97.7 F (36.5 C)  SpO2:  100%    Recent laboratory studies:  Results for orders placed or performed during the hospital encounter of 08/26/23  Surgical pcr screen   Specimen: Nasal Mucosa;  Nasal Swab  Result Value Ref Range   MRSA, PCR NEGATIVE NEGATIVE   Staphylococcus aureus NEGATIVE NEGATIVE  CBC  Result Value Ref Range   WBC 5.6 4.0 - 10.5 K/uL   RBC 5.30 4.22 - 5.81 MIL/uL   Hemoglobin 16.1 13.0 - 17.0 g/dL   HCT 62.1 30.8 - 65.7 %   MCV 89.2 80.0 - 100.0 fL   MCH 30.4 26.0 - 34.0 pg   MCHC 34.0 30.0 - 36.0 g/dL   RDW 84.6 96.2 - 95.2 %   Platelets 229 150 - 400 K/uL   nRBC 0.0 0.0 - 0.2 %  Basic metabolic panel  Result Value Ref Range   Sodium 137 135 - 145 mmol/L   Potassium 4.0 3.5 - 5.1 mmol/L   Chloride 103 98 - 111 mmol/L   CO2 25 22 - 32 mmol/L   Glucose, Bld 96 70 - 99 mg/dL   BUN 12 6 - 20 mg/dL   Creatinine, Ser 8.41 0.61 - 1.24 mg/dL   Calcium 9.1 8.9 - 32.4 mg/dL   GFR, Estimated >40 >10 mL/min   Anion gap 9 5 - 15  Urinalysis, w/ Reflex to Culture (Infection Suspected) -Urine, Clean Catch  Result Value Ref Range   Specimen Source URINE, CLEAN CATCH    Color, Urine YELLOW YELLOW   APPearance CLEAR CLEAR   Specific Gravity,  Urine 1.024 1.005 - 1.030   pH 7.0 5.0 - 8.0   Glucose, UA NEGATIVE NEGATIVE mg/dL   Hgb urine dipstick NEGATIVE NEGATIVE   Bilirubin Urine NEGATIVE NEGATIVE   Ketones, ur NEGATIVE NEGATIVE mg/dL   Protein, ur NEGATIVE NEGATIVE mg/dL   Nitrite NEGATIVE NEGATIVE   Leukocytes,Ua NEGATIVE NEGATIVE   RBC / HPF 0-5 0 - 5 RBC/hpf   WBC, UA 0-5 0 - 5 WBC/hpf   Bacteria, UA NONE SEEN NONE SEEN   Squamous Epithelial / HPF 0-5 0 - 5 /HPF   Mucus PRESENT     Discharge Medications:   Allergies as of 09/07/2023   No Known Allergies      Medication List     STOP taking these medications    ibuprofen 200 MG tablet Commonly known as: ADVIL   trolamine salicylate 10 % cream Commonly known as: ASPERCREME   Vitamin D (Ergocalciferol) 1.25 MG (50000 UNIT) Caps capsule Commonly known as: DRISDOL       TAKE these medications    acetaminophen 325 MG tablet Commonly known as: TYLENOL Take 1-2 tablets (325-650 mg  total) by mouth every 6 (six) hours as needed for mild pain (pain score 1-3) (pain score 1-3 or temp > 100.5).   aspirin 81 MG chewable tablet Chew 1 tablet (81 mg total) by mouth 2 (two) times daily.   celecoxib 200 MG capsule Commonly known as: CELEBREX Take 1 capsule (200 mg total) by mouth daily.   docusate sodium 100 MG capsule Commonly known as: COLACE Take 1 capsule (100 mg total) by mouth 2 (two) times daily.   losartan 50 MG tablet Commonly known as: COZAAR Take 50 mg by mouth daily.        Diagnostic Studies: XR Knee 1-2 Views Left  Result Date: 09/19/2023 AP and lateral views of the knee reviewed.  Total knee arthroplasty prosthesis in good position and alignment without any complicating features.  There is no evidence of dislocation, periprosthetic fracture, patella baja/alta    Disposition: Discharge disposition: 01-Home or Self Care       Discharge Instructions     Call MD / Call 911   Complete by: As directed    If you experience chest pain or shortness of breath, CALL 911 and be transported to the hospital emergency room.  If you develope a fever above 101 F, pus (white drainage) or increased drainage or redness at the wound, or calf pain, call your surgeon's office.   Constipation Prevention   Complete by: As directed    Drink plenty of fluids.  Prune juice may be helpful.  You may use a stool softener, such as Colace (over the counter) 100 mg twice a day.  Use MiraLax (over the counter) for constipation as needed.   Diet - low sodium heart healthy   Complete by: As directed    Discharge instructions   Complete by: As directed    Weightbearing as tolerated with walker Okay to shower dressing is waterproof as CPM machine 1 hour 3 times a day minimum starting on the day of discharge Always elevate the leg by placing pillows under the heel to facilitate the leg being straight   Increase activity slowly as tolerated   Complete by: As directed     Post-operative opioid taper instructions:   Complete by: As directed    POST-OPERATIVE OPIOID TAPER INSTRUCTIONS: It is important to wean off of your opioid medication as soon as possible. If you do not need  pain medication after your surgery it is ok to stop day one. Opioids include: Codeine, Hydrocodone(Norco, Vicodin), Oxycodone(Percocet, oxycontin) and hydromorphone amongst others.  Long term and even short term use of opiods can cause: Increased pain response Dependence Constipation Depression Respiratory depression And more.  Withdrawal symptoms can include Flu like symptoms Nausea, vomiting And more Techniques to manage these symptoms Hydrate well Eat regular healthy meals Stay active Use relaxation techniques(deep breathing, meditating, yoga) Do Not substitute Alcohol to help with tapering If you have been on opioids for less than two weeks and do not have pain than it is ok to stop all together.  Plan to wean off of opioids This plan should start within one week post op of your joint replacement. Maintain the same interval or time between taking each dose and first decrease the dose.  Cut the total daily intake of opioids by one tablet each day Next start to increase the time between doses. The last dose that should be eliminated is the evening dose.           Follow-up Information     [facility].   Specialty: Home Health Services Why: home health PT  services will be provided by [facility], start of care within 48 hours post discharge Contact information: [address] [place] [address] [phone]                  Signed: Burnard Bunting 10/12/2023, 11:32 PM

## 2023-10-13 ENCOUNTER — Encounter: Payer: Self-pay | Admitting: Rehabilitative and Restorative Service Providers"

## 2023-10-13 ENCOUNTER — Ambulatory Visit: Payer: BC Managed Care – PPO | Admitting: Rehabilitative and Restorative Service Providers"

## 2023-10-13 DIAGNOSIS — M25662 Stiffness of left knee, not elsewhere classified: Secondary | ICD-10-CM | POA: Diagnosis not present

## 2023-10-13 DIAGNOSIS — M25562 Pain in left knee: Secondary | ICD-10-CM

## 2023-10-13 DIAGNOSIS — R600 Localized edema: Secondary | ICD-10-CM

## 2023-10-13 DIAGNOSIS — R262 Difficulty in walking, not elsewhere classified: Secondary | ICD-10-CM | POA: Diagnosis not present

## 2023-10-13 DIAGNOSIS — M6281 Muscle weakness (generalized): Secondary | ICD-10-CM

## 2023-10-13 NOTE — Therapy (Signed)
OUTPATIENT PHYSICAL THERAPY LOWER EXTREMITY TREATMENT & PROGRESS NOTE  Progress Note Reporting Period 09/21/2023 to 10/13/2023  See note below for Objective Data and Assessment of Progress/Goals.     Patient Name: Bradley Burns MRN: [id number] DOB:1971/10/13, 52 y.o., male Today's Date: 10/13/2023  END OF SESSION:  PT End of Session - 10/13/23 1340     Visit Number 6    Number of Visits 16    Date for PT Re-Evaluation 11/16/23    Authorization Type BC[employer]    Authorization - Number of Visits 60    PT Start Time 1339    PT Stop Time 1443    PT Time Calculation (min) 64 min    Activity Tolerance Patient tolerated treatment well;No increased pain    Behavior During Therapy WFL for tasks assessed/performed             Past Medical History:  Diagnosis Date   Allergy    Hypertension    Past Surgical History:  Procedure Laterality Date   ANKLE ARTHROSCOPY Bilateral    APPENDECTOMY     KNEE ARTHROSCOPY Bilateral    NASAL SEPTUM SURGERY     SHOULDER ARTHROSCOPY Left    TOTAL KNEE ARTHROPLASTY Left 09/06/2023   Procedure: LEFT TOTAL KNEE ARTHROPLASTY;  Surgeon: Cammy Copa, MD;  Location: [facility] OR;  Service: Orthopedics;  Laterality: Left;  RNFA APRIL PLEASE   Patient Active Problem List   Diagnosis Date Noted   OA (osteoarthritis) of knee 09/06/2023   Arthritis of left knee 09/06/2023   Hyperlipidemia 09/08/2019   Vitamin D deficiency 06/03/2017   Hypertension 05/25/2017   Seasonal allergies 05/25/2017   History of smoking for 2-5 years 05/25/2017   Social alcohol use 05/25/2017   Obesity (BMI 30-39.9) 05/25/2017   S/P appendectomy 05/25/2017   GERD (gastroesophageal reflux disease) 05/25/2017    PCP: NA  REFERRING PROVIDER: Julieanne Cotton, PA-C  REFERRING DIAG:  Diagnosis  [phone] (ICD-10-CM) - S/P total knee arthroplasty, left    THERAPY DIAG:  Difficulty in walking, not elsewhere classified  Localized edema  Muscle weakness  (generalized)  Stiffness of left knee, not elsewhere classified  Left knee pain, unspecified chronicity  Rationale for Evaluation and Treatment: Rehabilitation  ONSET DATE: 09/06/2023 TKA  SUBJECTIVE:   SUBJECTIVE STATEMENT: Bradley Burns notes some medial knee pain related to edema.  He notes continued HEP compliance.  PERTINENT HISTORY: HTN, HLD, Lt shoulder scope, Lt TKA 09/06/2023, obesity   PAIN:  Are you having pain? 0-7/10 this week Pain location: Lt > Rt knee Pain description: Ache, sharp and throbbing at times Aggravating factors: Prolonged postures, particularly WB Relieving factors: Ice, meds (oxycodone, tramadol, gabapentin, tylenol, aspirin) as needed  PRECAUTIONS: None  RED FLAGS: None   WEIGHT BEARING RESTRICTIONS: No  FALLS:  Has patient fallen in last 6 months? No  LIVING ENVIRONMENT: Lives with: lives with their family and lives with their spouse Lives in: Mobile home Stairs:  OK with crutches and handrail Has following equipment at home: Single point cane, Environmental consultant - 2 wheeled, and Crutches  OCCUPATION: Truck driver, out of work presently  PLOF: Independent  PATIENT GOALS: Return to work, working on cars, Catering manager...  NEXT MD VISIT: 10/17/2023  OBJECTIVE:  Note: Objective measures were completed at Evaluation unless otherwise noted.  DIAGNOSTIC FINDINGS: AP and lateral views of the knee reviewed.  Total knee arthroplasty  prosthesis in good position and alignment without any complicating  features.  There is no evidence of dislocation, periprosthetic fracture,  patella baja/alta  PATIENT SURVEYS:  10/13/2023 FOTO 58 (Goal met)  Eval FOTO 36 (Goal 56 in 16 visits)  COGNITION: Overall cognitive status: Within functional limits for tasks assessed     SENSATION: No tingling or numbness  EDEMA:  Noted and not objectively assessed  LOWER EXTREMITY ROM:  Active ROM Left/Right  09/21/2023 Left  09/23/23 Left 10/11/23 Left 10/13/2023  Hip flexion       Hip extension      Hip abduction      Hip adduction      Hip internal rotation      Hip external rotation      Knee flexion 86/142 101 A: 110 P: 115 124  Knee extension -8/-2  A: -5 P: 0 -3  Ankle dorsiflexion      Ankle plantarflexion      Ankle inversion      Ankle eversion       (Blank rows = not tested)  LOWER EXTREMITY STRENGTH: Deferred at evaluation  MMT out of a possible 5 Left/Right  10/13/2023  Hip flexion   Hip extension   Hip abduction   Hip adduction   Hip internal rotation   Hip external rotation   Knee flexion   Knee extension 4/4+  Ankle dorsiflexion   Ankle plantarflexion   Ankle inversion   Ankle eversion    (Blank rows = not tested)   GAIT: Distance walked: 50 feet Assistive device utilized: Crutches Level of assistance: Complete Independence Comments: NA  TODAY'S TREATMENT:                                                                                                                              DATE:  10/13/2023 Recumbent bike Seat 10 & 5 for 8 minutes total Seated straight leg raises 3 sets of 5 with pause 2 seconds before lift, hold 2 seconds and slow eccentrics  Functional Activities: Single Leg Press 75# full extension and slow eccentrics 20 x Step-down off 4 inch step, slow eccentrics 10 x Step-up and over 6 inch step 10 x slow eccentrics  Neuromuscular re-education: Tandem balance: eyes open; head turning; eyes closed 2 x 30 seconds each Slow marching; slow marching heel to toe and slow marching/head turning 10 x each  Vaso Left knee High Pressure 34* 10 minutes   10/11/23 TherEx: Leg Press: bil LE 125 3 x 15 Leg Press: left LE 68# 3 x 15  Gastroc stretch on slant board: x 60 seconds  Soleus stretch on slant board: x 60 sec TRX  squats: 2 x 10  NMR Vectors 3 cones placed ant/lat, lat, post/lat c UE  Side stepping with green TB around thighs 20 feet x 4 Stepping over 9 inch hurdle x 6 (4 trips total) Single leg stance  on Airex x 4  Modalities:  Vasopnuematic: 34 deg, high compression x 10 minutes   10/06/2023 Therapeutic Exercise: Precor recumbent bike seat 8 level 3 for 8  min.   Gastroc stretch on incline board 30 sec hold 3 reps. Supine heel slide with strap & 18" ball with flexion stretch 5 sec and ext / leg press 5 sec 10 reps Supine heel prop with quad sets 5 sec X20  Therapeutic Activities: Double Leg Press 100# 15 x2  full extension to full flexion with 3 second hold at each extreme Single Leg Press 50# BLEs for 15 reps 2 sets full extension to full flexion with 3 second hold at each extreme Step ups 6 inch step X 10 forward, X 10 lateral leading with left LE using one UE support  Vaso 34* medium compression left knee 10 min with elevation.   PATIENT EDUCATION:  Education details: See above Person educated: Patient Education method: Explanation, Demonstration, Tactile cues, Verbal cues, and Handouts Education comprehension: verbalized understanding, returned demonstration, verbal cues required, tactile cues required, and needs further education  HOME EXERCISE PROGRAM: Access Code: [email or web address] URL: [email or web address] Date: 09/21/2023 Prepared by: Pauletta Browns  Exercises - Long Sitting Quad Set with Towel Roll Under Heel  - 5 x daily - 7 x weekly - 2 sets - 10 reps - 5 secponds hold - Seated Knee Flexion AAROM  - 3-5 x daily - 7 x weekly - 1 sets - 1 reps - 3 minutes hold - Seated Passive Knee Extension  - 2-3 x daily - 7 x weekly - 1 sets - 1 reps - 3-10 minutes hold  ASSESSMENT:  CLINICAL IMPRESSION: Bradley Burns is doing a great job with his HEP.  AROM is 3 - 0 - 124 degrees.  3 degree extension deficit is mostly edema limited and is expected to improve with his current HEP.  Quadriceps strength is progressing, although quadriceps lag is noted quickly with seated straight leg raises and quadriceps fatigue.  Continued quadriceps strength and endurance work will reduce swelling,  allow balance and functional activities to improve.  Bradley Burns is on-track to meet long-term goals in the time recommended at evaluation.  OBJECTIVE IMPAIRMENTS: Abnormal gait, decreased activity tolerance, decreased balance, decreased endurance, decreased knowledge of condition, difficulty walking, decreased ROM, decreased strength, increased edema, impaired perceived functional ability, obesity, and pain.   ACTIVITY LIMITATIONS: carrying, bending, sitting, standing, squatting, sleeping, stairs, and locomotion level  PARTICIPATION LIMITATIONS: driving, community activity, occupation, and yard work  PERSONAL FACTORS: HTN, HLD, Lt shoulder scope, Lt TKA 09/06/2023, obesity are also affecting patient's functional outcome.   REHAB POTENTIAL: Good  CLINICAL DECISION MAKING: Stable/uncomplicated  EVALUATION COMPLEXITY: Low   GOALS: Goals reviewed with patient? Yes  SHORT TERM GOALS: Target date: 10/19/2023 Bradley Burns will be independent with his day 1 HEP Baseline: Started 09/21/2023 Goal status: Met 09/23/2023  2.  Improve left knee AROM to 5 - 0 - 105 degrees Baseline: 8 - 0 - 86 degrees Goal status: MET 10/11/2023   LONG TERM GOALS: Target date: 11/15/2022  Improve FOTO to 56 in 16 visits Baseline: 36 Goal status: Met 10/13/2023  2.  Bradley Burns will report left knee pain consistently 0-4/10 on the Numeric Pain Rating Scale Baseline: 2-9/10 Goal status: Partially Met 10/13/2023  3.  Improve left knee AROM to 2 - 0 - 115 degrees Baseline: 8 - 0 - 86 degrees Goal status: Partially Met 10/13/2023  4.  Bradley Burns will have improved quadriceps strength as assessed by assistive device free gait without increasing edema Baseline: Bilateral axillary crutches, walker or cane required Goal status: Partially Met 10/13/2023  5.  Bradley Burns will be independent with his long-term HEP  at DC Baseline: Started 09/21/2023 Goal status: On Going 10/13/2023  PLAN:  PT FREQUENCY: 1-2x/week  PT DURATION: 4  weeks  PLANNED INTERVENTIONS: 97110-Therapeutic exercises, 97530- Therapeutic activity, 97112- Neuromuscular re-education, 97535- Self Care, 09811- Manual therapy, [phone]- Gait training, [phone]- Vasopneumatic device, Patient/Family education, Balance training, Stair training, Joint mobilization, and Cryotherapy  PLAN FOR NEXT SESSION: Quadriceps strength, gait, balance and functional progressions.   Cherlyn Cushing, PT, MPT 10/13/2023, 2:46 PM

## 2023-10-17 ENCOUNTER — Ambulatory Visit (INDEPENDENT_AMBULATORY_CARE_PROVIDER_SITE_OTHER): Payer: BC Managed Care – PPO | Admitting: Orthopedic Surgery

## 2023-10-17 ENCOUNTER — Encounter: Payer: Self-pay | Admitting: Orthopedic Surgery

## 2023-10-17 VITALS — Ht 71.0 in | Wt 251.0 lb

## 2023-10-17 DIAGNOSIS — Z96652 Presence of left artificial knee joint: Secondary | ICD-10-CM

## 2023-10-17 MED ORDER — HYDROCODONE-ACETAMINOPHEN 5-325 MG PO TABS: ORAL_TABLET | ORAL | 0 refills | Status: AC

## 2023-10-17 MED ORDER — AMOXICILLIN 500 MG PO TABS: ORAL_TABLET | ORAL | 0 refills | Status: AC

## 2023-10-17 MED ORDER — HYDROCODONE-ACETAMINOPHEN 5-325 MG PO TABS: 1.0000 | ORAL_TABLET | Freq: Four times a day (QID) | ORAL | 0 refills | Status: AC | PRN

## 2023-10-17 NOTE — Progress Notes (Signed)
   Post-Op Visit Note   Patient: Bradley Burns           Date of Birth: [birth year]           MRN: [id number] Visit Date: 10/17/2023 PCP: Pcp, No   Assessment & Plan:  Chief Complaint:  Chief Complaint  Patient presents with   Left Knee - Follow-up    09/06/2023 Left TKA   Visit Diagnoses:  1. S/P total knee arthroplasty, left     Plan: [name] is now 6 weeks out left total knee replacement.  He is doing physical therapy working on balance and strengthening.  Does report some popping occasionally.  On exam he is get excellent range of motion from 2-1 20 of flexion.  Trace effusion is present in the left knee.  Has good stability to varus and valgus stress at 0 and 30 degrees.  I think he has been doing such a good job with his range of motion that the knee has a slight amount of opening about 2 to 3 mm to valgus stress at 30 degrees compared to prior visit.  I think as his leg gets stronger the mechanical popping will diminish.  He also reports having some tooth pain.  He is going to see the dentist but this may require a root canal.  Antibiotic prescription provided today.  We will refill his Norco as well.  Follow-up with [place] in 5 weeks for clinical recheck and possible return to work at that time.  Follow-Up Instructions: No follow-ups on file.   Orders:  No orders of the defined types were placed in this encounter.  No orders of the defined types were placed in this encounter.   Imaging: No results found.  PMFS History: Patient Active Problem List   Diagnosis Date Noted   OA (osteoarthritis) of knee 09/06/2023   Arthritis of left knee 09/06/2023   Hyperlipidemia 09/08/2019   Vitamin D deficiency 06/03/2017   Hypertension 05/25/2017   Seasonal allergies 05/25/2017   History of smoking for 2-5 years 05/25/2017   Social alcohol use 05/25/2017   Obesity (BMI 30-39.9) 05/25/2017   S/P appendectomy 05/25/2017   GERD (gastroesophageal reflux disease) 05/25/2017   Past  Medical History:  Diagnosis Date   Allergy    Hypertension     Family History  Problem Relation Age of Onset   Hypertension Father     Past Surgical History:  Procedure Laterality Date   ANKLE ARTHROSCOPY Bilateral    APPENDECTOMY     KNEE ARTHROSCOPY Bilateral    NASAL SEPTUM SURGERY     SHOULDER ARTHROSCOPY Left    TOTAL KNEE ARTHROPLASTY Left 09/06/2023   Procedure: LEFT TOTAL KNEE ARTHROPLASTY;  Surgeon: Cammy Copa, MD;  Location: [facility] OR;  Service: Orthopedics;  Laterality: Left;  RNFA APRIL PLEASE   Social History   Occupational History   Not on file  Tobacco Use   Smoking status: Never   Smokeless tobacco: Never  Vaping Use   Vaping status: Never Used  Substance and Sexual Activity   Alcohol use: Yes    Alcohol/week: 4.0 standard drinks of alcohol    Types: 4 Standard drinks or equivalent per week   Drug use: No   Sexual activity: Not Currently

## 2023-10-17 NOTE — Addendum Note (Signed)
Addended by: Rise Paganini on: 10/17/2023 04:18 PM   Modules accepted: Orders

## 2023-10-17 NOTE — Addendum Note (Signed)
Addended byPrescott Parma on: 10/17/2023 04:16 PM   Modules accepted: Orders

## 2023-10-19 ENCOUNTER — Telehealth: Payer: Self-pay

## 2023-10-19 NOTE — Telephone Encounter (Signed)
I basically told him that if the dentist said the tooth is infected he should get it out immediately.  If this is something that is pseudo elective I would wait until 3 months postop.  However since it is a root canal my understanding is that could be infected so I would go ahead and get it out but just due to amoxicillin 2 g by mouth 1 hour before the procedure.  Thanks

## 2023-10-19 NOTE — Telephone Encounter (Signed)
FYI-  Dentist called concerning medical clearance for patient to have tooth extracted.  Stated that a form will be faxed to our office.

## 2023-10-19 NOTE — Telephone Encounter (Signed)
Form completed.

## 2023-10-24 ENCOUNTER — Encounter (HOSPITAL_BASED_OUTPATIENT_CLINIC_OR_DEPARTMENT_OTHER): Payer: Self-pay

## 2023-10-24 ENCOUNTER — Telehealth: Payer: Self-pay | Admitting: Orthopedic Surgery

## 2023-10-24 NOTE — Telephone Encounter (Signed)
Oow minimum 3 mos from date of surgery

## 2023-10-24 NOTE — Telephone Encounter (Signed)
Patient called and needed a note from the Doctor to verify how long he is out. CB#[phone]

## 2023-10-24 NOTE — Telephone Encounter (Signed)
Note completed, called and advised pt

## 2023-11-03 ENCOUNTER — Encounter: Payer: Self-pay | Admitting: Rehabilitative and Restorative Service Providers"

## 2023-11-03 ENCOUNTER — Ambulatory Visit: Payer: BC Managed Care – PPO | Admitting: Rehabilitative and Restorative Service Providers"

## 2023-11-03 DIAGNOSIS — R600 Localized edema: Secondary | ICD-10-CM

## 2023-11-03 DIAGNOSIS — R262 Difficulty in walking, not elsewhere classified: Secondary | ICD-10-CM

## 2023-11-03 DIAGNOSIS — M25662 Stiffness of left knee, not elsewhere classified: Secondary | ICD-10-CM

## 2023-11-03 DIAGNOSIS — M6281 Muscle weakness (generalized): Secondary | ICD-10-CM

## 2023-11-03 DIAGNOSIS — M25562 Pain in left knee: Secondary | ICD-10-CM

## 2023-11-03 NOTE — Therapy (Signed)
 OUTPATIENT PHYSICAL THERAPY LOWER EXTREMITY TREATMENT NOTE  Patient Name: Bradley Burns MRN: [id number] DOB:08-29-1971, 53 y.o., male Today's Date: 11/03/2023  END OF SESSION:  PT End of Session - 11/03/23 1345     Visit Number 7    Number of Visits 16    Date for PT Re-Evaluation 11/16/23    Authorization Type BCBS    Authorization - Number of Visits 60    PT Start Time 1343    PT Stop Time 1424    PT Time Calculation (min) 41 min    Activity Tolerance Patient tolerated treatment well;No increased pain;Patient limited by pain    Behavior During Therapy Bradley Burns for tasks assessed/performed              Past Medical History:  Diagnosis Date   Allergy    Hypertension    Past Surgical History:  Procedure Laterality Date   ANKLE ARTHROSCOPY Bilateral    APPENDECTOMY     KNEE ARTHROSCOPY Bilateral    NASAL SEPTUM SURGERY     SHOULDER ARTHROSCOPY Left    TOTAL KNEE ARTHROPLASTY Left 09/06/2023   Procedure: LEFT TOTAL KNEE ARTHROPLASTY;  Surgeon: Bradley Cordella Hamilton, MD;  Location: [facility] OR;  Service: Orthopedics;  Laterality: Left;  RNFA Bradley Burns PLEASE   Patient Active Problem List   Diagnosis Date Noted   OA (osteoarthritis) of knee 09/06/2023   Arthritis of left knee 09/06/2023   Hyperlipidemia 09/08/2019   Vitamin D  deficiency 06/03/2017   Hypertension 05/25/2017   Seasonal allergies 05/25/2017   History of smoking for 2-5 years 05/25/2017   Social alcohol use 05/25/2017   Obesity (BMI 30-39.9) 05/25/2017   S/P appendectomy 05/25/2017   GERD (gastroesophageal reflux disease) 05/25/2017    PCP: NA  REFERRING PROVIDER: Carlin LITTIE Calix, PA-C  REFERRING DIAG:  Diagnosis  [phone] (ICD-10-CM) - S/P total knee arthroplasty, left    THERAPY DIAG:  Difficulty in walking, not elsewhere classified  Localized edema  Muscle weakness (generalized)  Stiffness of left knee, not elsewhere classified  Left knee pain, unspecified chronicity  Rationale for Evaluation  and Treatment: Rehabilitation  ONSET DATE: 09/06/2023 TKA  SUBJECTIVE:   SUBJECTIVE STATEMENT: [name] notes medial knee pain is most limiting.  He continues his good HEP compliance.  PERTINENT HISTORY: HTN, HLD, Lt shoulder scope, Lt TKA 09/06/2023, obesity   PAIN:  Are you having pain? 0-7/10 this week Pain location: Lt > Rt knee Pain description: Ache, sharp and throbbing at times Aggravating factors: Prolonged postures, particularly WB Relieving factors: Ice, meds (hydrocodone , tylenol , aspirin ) as needed  PRECAUTIONS: None  RED FLAGS: None   WEIGHT BEARING RESTRICTIONS: No  FALLS:  Has patient fallen in last 6 months? No  LIVING ENVIRONMENT: Lives with: lives with their family and lives with their spouse Lives in: Mobile home Stairs:  OK with crutches and handrail Has following equipment at home: Single point cane, Environmental Consultant - 2 wheeled, and Crutches  OCCUPATION: Truck driver, out of work presently  PLOF: Independent  PATIENT GOALS: Return to work, working on cars, catering manager...  NEXT MD VISIT: 11/21/2023  OBJECTIVE:  Note: Objective measures were completed at Evaluation unless otherwise noted.  DIAGNOSTIC FINDINGS: AP and lateral views of the knee reviewed.  Total knee arthroplasty  prosthesis in good position and alignment without any complicating  features.  There is no evidence of dislocation, periprosthetic fracture,  patella baja/alta  PATIENT SURVEYS:  10/13/2023 FOTO 58 (Goal met)  Eval FOTO 36 (Goal 56 in 16 visits)  COGNITION: Overall cognitive status: Within functional limits for tasks assessed     SENSATION: No tingling or numbness  EDEMA:  Noted and not objectively assessed  LOWER EXTREMITY ROM:  Active ROM Left/Right  09/21/2023 Left  09/23/23 Left 10/11/23 Left 10/13/2023 Left/Right 11/03/2023  Hip flexion       Hip extension       Hip abduction       Hip adduction       Hip internal rotation       Hip external rotation       Knee flexion  86/142 101 A: 110 P: 115 124 130/148  Knee extension -8/-2  A: -5 P: 0 -3 -2/0  Ankle dorsiflexion       Ankle plantarflexion       Ankle inversion       Ankle eversion        (Blank rows = not tested)  LOWER EXTREMITY STRENGTH: Deferred at evaluation  MMT out of a possible 5 Left/Right  10/13/2023  Hip flexion   Hip extension   Hip abduction   Hip adduction   Hip internal rotation   Hip external rotation   Knee flexion   Knee extension 4/4+  Ankle dorsiflexion   Ankle plantarflexion   Ankle inversion   Ankle eversion    (Blank rows = not tested)   GAIT: Distance walked: 50 feet Assistive device utilized: Crutches Level of assistance: Complete Independence Comments: NA  TODAY'S TREATMENT:                                                                                                                              DATE:  11/03/2023 Recumbent bike Seat 8 for 5 minutes total Seated straight leg raises 3 sets of 5 with pause 2 seconds before lift, hold 2 seconds and slow eccentrics Seated knee extension machine 90-40 degrees with slow eccentrics 35# 8 x up with both, down with 1 (do bilateral)  Functional Activities: Double Leg Press 100# full extension and slow eccentrics 15 x Single Leg Press 87# full extension and slow eccentrics 10 x  Neuromuscular re-education: Single-leg stance: eyes open; head turning; eyes closed 2 x 10 seconds each side   10/13/2023 Recumbent bike Seat 10 & 5 for 8 minutes total Seated straight leg raises 3 sets of 5 with pause 2 seconds before lift, hold 2 seconds and slow eccentrics  Functional Activities: Single Leg Press 75# full extension and slow eccentrics 20 x Step-down off 4 inch step, slow eccentrics 10 x Step-up and over 6 inch step 10 x slow eccentrics  Neuromuscular re-education: Tandem balance: eyes open; head turning; eyes closed 2 x 30 seconds each Slow marching; slow marching heel to toe and slow marching/head turning 10  x each  Vaso Left knee High Pressure 34* 10 minutes   10/11/23 TherEx: Leg Press: bil LE 125 3 x 15 Leg Press: left LE 68# 3 x 15  Gastroc stretch  on slant board: x 60 seconds  Soleus stretch on slant board: x 60 sec TRX  squats: 2 x 10  NMR Vectors 3 cones placed ant/lat, lat, post/lat c UE  Side stepping with green TB around thighs 20 feet x 4 Stepping over 9 inch hurdle x 6 (4 trips total) Single leg stance on Airex x 4  Modalities:  Vasopnuematic: 34 deg, high compression x 10 minutes  PATIENT EDUCATION:  Education details: See above Person educated: Patient Education method: Explanation, Demonstration, Tactile cues, Verbal cues, and Handouts Education comprehension: verbalized understanding, returned demonstration, verbal cues required, tactile cues required, and needs further education  HOME EXERCISE PROGRAM: Access Code: [email or web address] URL: [email or web address] Date: 09/21/2023 Prepared by: Lamar Ivory  Exercises - Long Sitting Quad Set with Towel Roll Under Heel  - 5 x daily - 7 x weekly - 2 sets - 10 reps - 5 secponds hold - Seated Knee Flexion AAROM  - 3-5 x daily - 7 x weekly - 1 sets - 1 reps - 3 minutes hold - Seated Passive Knee Extension  - 2-3 x daily - 7 x weekly - 1 sets - 1 reps - 3-10 minutes hold  ASSESSMENT:  CLINICAL IMPRESSION: Velinda continues to do a great job with his HEP.  He is very motivated to return to work as early as possible.  Quadriceps strengthening remains the emphasis and continued quadriceps strength and endurance work will reduce swelling, allow balance and functional activities to improve.  [name] remains on-track to meet long-term goals in the time recommended at evaluation.  OBJECTIVE IMPAIRMENTS: Abnormal gait, decreased activity tolerance, decreased balance, decreased endurance, decreased knowledge of condition, difficulty walking, decreased ROM, decreased strength, increased edema, impaired perceived functional ability,  obesity, and pain.   ACTIVITY LIMITATIONS: carrying, bending, sitting, standing, squatting, sleeping, stairs, and locomotion level  PARTICIPATION LIMITATIONS: driving, community activity, occupation, and yard work  PERSONAL FACTORS: HTN, HLD, Lt shoulder scope, Lt TKA 09/06/2023, obesity are also affecting patient's functional outcome.   REHAB POTENTIAL: Good  CLINICAL DECISION MAKING: Stable/uncomplicated  EVALUATION COMPLEXITY: Low   GOALS: Goals reviewed with patient? Yes  SHORT TERM GOALS: Target date: 10/19/2023 Velinda will be independent with his day 1 HEP Baseline: Started 09/21/2023 Goal status: Met 09/23/2023  2.  Improve left knee AROM to 5 - 0 - 105 degrees Baseline: 8 - 0 - 86 degrees Goal status: MET 10/11/2023   LONG TERM GOALS: Target date: 11/15/2022  Improve FOTO to 56 in 16 visits Baseline: 36 Goal status: Met 10/13/2023  2.  [name] will report left knee pain consistently 0-4/10 on the Numeric Pain Rating Scale Baseline: 2-9/10 Goal status: Partially Met 11/03/2023  3.  Improve left knee AROM to 2 - 0 - 115 degrees Baseline: 8 - 0 - 86 degrees Goal status: Met 11/03/2023  4.  Velinda will have improved quadriceps strength as assessed by assistive device free gait without increasing edema Baseline: Bilateral axillary crutches, walker or cane required Goal status: Partially Met 11/03/2023  5.  Velinda will be independent with his long-term HEP at DC Baseline: Started 09/21/2023 Goal status: On Going 11/03/2023  PLAN:  PT FREQUENCY: 1-2x/week  PT DURATION: 4 weeks  PLANNED INTERVENTIONS: 97110-Therapeutic exercises, 97530- Therapeutic activity, 97112- Neuromuscular re-education, 97535- Self Care, [id number]- Manual therapy, [phone]- Gait training, [phone]- Vasopneumatic device, Patient/Family education, Balance training, Stair training, Joint mobilization, and Cryotherapy  PLAN FOR NEXT SESSION: Quadriceps strength, gait, balance and functional progressions for return to  work.   Rob  LELON Ivory, PT, MPT 11/03/2023, 2:25 PM

## 2023-11-10 ENCOUNTER — Encounter: Payer: Self-pay | Admitting: Rehabilitative and Restorative Service Providers"

## 2023-11-10 ENCOUNTER — Ambulatory Visit (INDEPENDENT_AMBULATORY_CARE_PROVIDER_SITE_OTHER): Payer: BC Managed Care – PPO | Admitting: Rehabilitative and Restorative Service Providers"

## 2023-11-10 DIAGNOSIS — M25662 Stiffness of left knee, not elsewhere classified: Secondary | ICD-10-CM | POA: Diagnosis not present

## 2023-11-10 DIAGNOSIS — R262 Difficulty in walking, not elsewhere classified: Secondary | ICD-10-CM

## 2023-11-10 DIAGNOSIS — R600 Localized edema: Secondary | ICD-10-CM | POA: Diagnosis not present

## 2023-11-10 DIAGNOSIS — M25562 Pain in left knee: Secondary | ICD-10-CM

## 2023-11-10 DIAGNOSIS — M6281 Muscle weakness (generalized): Secondary | ICD-10-CM | POA: Diagnosis not present

## 2023-11-10 NOTE — Therapy (Signed)
 OUTPATIENT PHYSICAL THERAPY LOWER EXTREMITY TREATMENT NOTE  Patient Name: Bradley Burns MRN: [id number] DOB:09/21/1971, 53 y.o., male Today's Date: 11/10/2023  END OF SESSION:  PT End of Session - 11/10/23 1424     Visit Number 8    Number of Visits 16    Date for PT Re-Evaluation 11/16/23    Authorization Type BCBS    Authorization - Number of Visits 60    PT Start Time 1345    PT Stop Time 1428    PT Time Calculation (min) 43 min    Activity Tolerance Patient tolerated treatment well;No increased pain    Behavior During Therapy WFL for tasks assessed/performed             Past Medical History:  Diagnosis Date   Allergy    Hypertension    Past Surgical History:  Procedure Laterality Date   ANKLE ARTHROSCOPY Bilateral    APPENDECTOMY     KNEE ARTHROSCOPY Bilateral    NASAL SEPTUM SURGERY     SHOULDER ARTHROSCOPY Left    TOTAL KNEE ARTHROPLASTY Left 09/06/2023   Procedure: LEFT TOTAL KNEE ARTHROPLASTY;  Surgeon: Addie Cordella Hamilton, MD;  Location: [facility] OR;  Service: Orthopedics;  Laterality: Left;  RNFA APRIL PLEASE   Patient Active Problem List   Diagnosis Date Noted   OA (osteoarthritis) of knee 09/06/2023   Arthritis of left knee 09/06/2023   Hyperlipidemia 09/08/2019   Vitamin D  deficiency 06/03/2017   Hypertension 05/25/2017   Seasonal allergies 05/25/2017   History of smoking for 2-5 years 05/25/2017   Social alcohol use 05/25/2017   Obesity (BMI 30-39.9) 05/25/2017   S/P appendectomy 05/25/2017   GERD (gastroesophageal reflux disease) 05/25/2017    PCP: NA  REFERRING PROVIDER: Carlin LITTIE Calix, PA-C  REFERRING DIAG:  Diagnosis  [phone] (ICD-10-CM) - S/P total knee arthroplasty, left    THERAPY DIAG:  Difficulty in walking, not elsewhere classified  Localized edema  Muscle weakness (generalized)  Stiffness of left knee, not elsewhere classified  Left knee pain, unspecified chronicity  Rationale for Evaluation and Treatment:  Rehabilitation  ONSET DATE: 09/06/2023 TKA  SUBJECTIVE:   SUBJECTIVE STATEMENT: Bradley Burns notes medial knee pain bilaterally.  He continues his good HEP compliance.  He has been doing more stationary bike vs walking, which was recommended by PT.  Getting better sleep at night due to less pain.  PERTINENT HISTORY: HTN, HLD, Lt shoulder scope, Lt TKA 09/06/2023, obesity   PAIN:  Are you having pain? 0-6/10 this week Pain location: Lt < Rt knee Pain description: Ache, sharp and throbbing at times Aggravating factors: Prolonged postures, particularly WB, too much walking Relieving factors: Ice, meds (hydrocodone  PRN, not daily, tylenol  2-4 a day) as needed  PRECAUTIONS: None  RED FLAGS: None   WEIGHT BEARING RESTRICTIONS: No  FALLS:  Has patient fallen in last 6 months? No  LIVING ENVIRONMENT: Lives with: lives with their family and lives with their spouse Lives in: Mobile home Stairs:  OK with crutches and handrail Has following equipment at home: Single point cane, Environmental Consultant - 2 wheeled, and Crutches  OCCUPATION: Truck driver, out of work presently  PLOF: Independent  PATIENT GOALS: Return to work, working on cars, catering manager...  NEXT MD VISIT: 11/21/2023  OBJECTIVE:  Note: Objective measures were completed at Evaluation unless otherwise noted.  DIAGNOSTIC FINDINGS: AP and lateral views of the knee reviewed.  Total knee arthroplasty  prosthesis in good position and alignment without any complicating  features.  There is no  evidence of dislocation, periprosthetic fracture,  patella baja/alta  PATIENT SURVEYS:  10/13/2023 FOTO 58 (Goal met)  Eval FOTO 36 (Goal 56 in 16 visits)  COGNITION: Overall cognitive status: Within functional limits for tasks assessed     SENSATION: No tingling or numbness  EDEMA:  Noted and not objectively assessed  LOWER EXTREMITY ROM:  Active ROM Left/Right  09/21/2023 Left  09/23/23 Left 10/11/23 Left 10/13/2023 Left/Right 11/03/2023  Hip  flexion       Hip extension       Hip abduction       Hip adduction       Hip internal rotation       Hip external rotation       Knee flexion 86/142 101 A: 110 P: 115 124 130/148  Knee extension -8/-2  A: -5 P: 0 -3 -2/0  Ankle dorsiflexion       Ankle plantarflexion       Ankle inversion       Ankle eversion        (Blank rows = not tested)  LOWER EXTREMITY STRENGTH: Deferred at evaluation  MMT out of a possible 5 Left/Right  10/13/2023  Hip flexion   Hip extension   Hip abduction   Hip adduction   Hip internal rotation   Hip external rotation   Knee flexion   Knee extension 4/4+  Ankle dorsiflexion   Ankle plantarflexion   Ankle inversion   Ankle eversion    (Blank rows = not tested)   GAIT: Distance walked: 50 feet Assistive device utilized: Crutches Level of assistance: Complete Independence Comments: NA  TODAY'S TREATMENT:                                                                                                                              DATE:  11/10/2023 Recumbent bike Seat 8 and 10 for 3 minutes each Seated straight leg raises 3 sets of 5 with pause 2 seconds before lift, hold 2 seconds and slow eccentrics 1# Seated knee extension machine 90-40 degrees with slow eccentrics 35# 2 sets of 5x up with both, down with 1 (do bilateral)  Functional Activities: Dynamic heel to toe balance, forward and back 4 laps Dynamic slow marching to heel/toe 4 laps  Neuromuscular re-education: Single-leg stance: eyes open; head turning; eyes closed 2 x 20 seconds each side   11/03/2023 Recumbent bike Seat 8 for 5 minutes total Seated straight leg raises 3 sets of 5 with pause 2 seconds before lift, hold 2 seconds and slow eccentrics Seated knee extension machine 90-40 degrees with slow eccentrics 35# 8 x up with both, down with 1 (do bilateral)  Functional Activities: Double Leg Press 100# full extension and slow eccentrics 15 x Single Leg Press 87# full  extension and slow eccentrics 10 x  Neuromuscular re-education: Single-leg stance: eyes open; head turning; eyes closed 2 x 10 seconds each side   10/13/2023 Recumbent bike Seat 10 &  5 for 8 minutes total Seated straight leg raises 3 sets of 5 with pause 2 seconds before lift, hold 2 seconds and slow eccentrics  Functional Activities: Single Leg Press 75# full extension and slow eccentrics 20 x Step-down off 4 inch step, slow eccentrics 10 x Step-up and over 6 inch step 10 x slow eccentrics  Neuromuscular re-education: Tandem balance: eyes open; head turning; eyes closed 2 x 30 seconds each Slow marching; slow marching heel to toe and slow marching/head turning 10 x each  Vaso Left knee High Pressure 34* 10 minutes   PATIENT EDUCATION:  Education details: See above Person educated: Patient Education method: Explanation, Demonstration, Tactile cues, Verbal cues, and Handouts Education comprehension: verbalized understanding, returned demonstration, verbal cues required, tactile cues required, and needs further education  HOME EXERCISE PROGRAM: Access Code: [email or web address] URL: [email or web address] Date: 09/21/2023 Prepared by: Lamar Ivory  Exercises - Long Sitting Quad Set with Towel Roll Under Heel  - 5 x daily - 7 x weekly - 2 sets - 10 reps - 5 secponds hold - Seated Knee Flexion AAROM  - 3-5 x daily - 7 x weekly - 1 sets - 1 reps - 3 minutes hold - Seated Passive Knee Extension  - 2-3 x daily - 7 x weekly - 1 sets - 1 reps - 3-10 minutes hold  ASSESSMENT:  CLINICAL IMPRESSION: Velinda is preparing to return to work as early as 11/22/2023 and he is focusing on balance along with quadriceps and general lower extremity strength. We will reassess next visit for possible transfer into independent rehabilitation.  OBJECTIVE IMPAIRMENTS: Abnormal gait, decreased activity tolerance, decreased balance, decreased endurance, decreased knowledge of condition, difficulty  walking, decreased ROM, decreased strength, increased edema, impaired perceived functional ability, obesity, and pain.   ACTIVITY LIMITATIONS: carrying, bending, sitting, standing, squatting, sleeping, stairs, and locomotion level  PARTICIPATION LIMITATIONS: driving, community activity, occupation, and yard work  PERSONAL FACTORS: HTN, HLD, Lt shoulder scope, Lt TKA 09/06/2023, obesity are also affecting patient's functional outcome.   REHAB POTENTIAL: Good  CLINICAL DECISION MAKING: Stable/uncomplicated  EVALUATION COMPLEXITY: Low   GOALS: Goals reviewed with patient? Yes  SHORT TERM GOALS: Target date: 10/19/2023 Velinda will be independent with his day 1 HEP Baseline: Started 09/21/2023 Goal status: Met 09/23/2023  2.  Improve left knee AROM to 5 - 0 - 105 degrees Baseline: 8 - 0 - 86 degrees Goal status: MET 10/11/2023   LONG TERM GOALS: Target date: 11/15/2022  Improve FOTO to 56 in 16 visits Baseline: 36 Goal status: Met 10/13/2023  2.  Bradley Burns will report left knee pain consistently 0-4/10 on the Numeric Pain Rating Scale Baseline: 2-9/10 Goal status: Partially Met 11/10/2023  3.  Improve left knee AROM to 2 - 0 - 115 degrees Baseline: 8 - 0 - 86 degrees Goal status: Met 11/03/2023  4.  Velinda will have improved quadriceps strength as assessed by assistive device free gait without increasing edema Baseline: Bilateral axillary crutches, walker or cane required Goal status: Partially Met 11/10/2023  5.  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  Reassess and possible DC.   Myer LELON Ivory, PT, MPT 11/10/2023, 2:37 PM

## 2023-11-16 ENCOUNTER — Encounter: Payer: Self-pay | Admitting: Rehabilitative and Restorative Service Providers"

## 2023-11-16 ENCOUNTER — Ambulatory Visit (INDEPENDENT_AMBULATORY_CARE_PROVIDER_SITE_OTHER): Payer: BC Managed Care – PPO | Admitting: Rehabilitative and Restorative Service Providers"

## 2023-11-16 DIAGNOSIS — M25662 Stiffness of left knee, not elsewhere classified: Secondary | ICD-10-CM | POA: Diagnosis not present

## 2023-11-16 DIAGNOSIS — R262 Difficulty in walking, not elsewhere classified: Secondary | ICD-10-CM

## 2023-11-16 DIAGNOSIS — R600 Localized edema: Secondary | ICD-10-CM | POA: Diagnosis not present

## 2023-11-16 DIAGNOSIS — M25562 Pain in left knee: Secondary | ICD-10-CM

## 2023-11-16 DIAGNOSIS — M6281 Muscle weakness (generalized): Secondary | ICD-10-CM

## 2023-11-16 NOTE — Therapy (Addendum)
OUTPATIENT PHYSICAL THERAPY LOWER EXTREMITY TREATMENT/ Re-Certification NOTE  Patient Name: Bradley Burns MRN: [id number] DOB:03/24/1971, 53 y.o., male Today's Date: 11/16/2023  END OF SESSION:  PT End of Session - 11/16/23 1348     Visit Number 9    Number of Visits 16    Date for PT Re-Evaluation 11/23/23    Authorization Type BCBS    Authorization - Number of Visits 60    PT Start Time 1345    PT Stop Time 1435    PT Time Calculation (min) 50 min    Activity Tolerance Patient tolerated treatment well;No increased pain;Patient limited by pain    Behavior During Therapy [facility] for tasks assessed/performed              Past Medical History:  Diagnosis Date   Allergy    Hypertension    Past Surgical History:  Procedure Laterality Date   ANKLE ARTHROSCOPY Bilateral    APPENDECTOMY     KNEE ARTHROSCOPY Bilateral    NASAL SEPTUM SURGERY     SHOULDER ARTHROSCOPY Left    TOTAL KNEE ARTHROPLASTY Left 09/06/2023   Procedure: LEFT TOTAL KNEE ARTHROPLASTY;  Surgeon: Cammy Copa, MD;  Location: [facility] OR;  Service: Orthopedics;  Laterality: Left;  RNFA APRIL PLEASE   Patient Active Problem List   Diagnosis Date Noted   OA (osteoarthritis) of knee 09/06/2023   Arthritis of left knee 09/06/2023   Hyperlipidemia 09/08/2019   Vitamin D deficiency 06/03/2017   Hypertension 05/25/2017   Seasonal allergies 05/25/2017   History of smoking for 2-5 years 05/25/2017   Social alcohol use 05/25/2017   Obesity (BMI 30-39.9) 05/25/2017   S/P appendectomy 05/25/2017   GERD (gastroesophageal reflux disease) 05/25/2017    PCP: NA  REFERRING PROVIDER: Julieanne Cotton, PA-C  REFERRING DIAG:  Diagnosis  [phone] (ICD-10-CM) - S/P total knee arthroplasty, left    THERAPY DIAG:  Difficulty in walking, not elsewhere classified  Localized edema  Muscle weakness (generalized)  Stiffness of left knee, not elsewhere classified  Left knee pain, unspecified  chronicity  Rationale for Evaluation and Treatment: Rehabilitation  ONSET DATE: 09/06/2023 TKA  SUBJECTIVE:   SUBJECTIVE STATEMENT: [name] notes medial knee pain bilaterally, right much > left.  He continues his good HEP compliance.  He has been good about doing more stationary bike vs walking, which was recommended by PT.  Sleep continues to improve.  PERTINENT HISTORY: HTN, HLD, Lt shoulder scope, Lt TKA 09/06/2023, obesity   PAIN:  Are you having pain? 0-6/10 this week Pain location: Lt < Rt knee Pain description: Ache, sharp and throbbing at times Aggravating factors: Prolonged postures, particularly WB, too much walking Relieving factors: Ice, meds (hydrocodone PRN, not daily, tylenol 2-4 a day) as needed  PRECAUTIONS: None  RED FLAGS: None   WEIGHT BEARING RESTRICTIONS: No  FALLS:  Has patient fallen in last 6 months? No  LIVING ENVIRONMENT: Lives with: lives with their family and lives with their spouse Lives in: Mobile home Stairs:  OK with crutches and handrail Has following equipment at home: Single point cane, Environmental consultant - 2 wheeled, and Crutches  OCCUPATION: Truck driver, out of work presently  PLOF: Independent  PATIENT GOALS: Return to work, working on cars, Catering manager...  NEXT MD VISIT: 11/30/2023  OBJECTIVE:  Note: Objective measures were completed at Evaluation unless otherwise noted.  DIAGNOSTIC FINDINGS: AP and lateral views of the knee reviewed.  Total knee arthroplasty  prosthesis in good position and alignment without any complicating  features.  There is no evidence of dislocation, periprosthetic fracture,  patella baja/alta  PATIENT SURVEYS:  10/13/2023 FOTO 58 (Goal met)  Eval FOTO 36 (Goal 56 in 16 visits)  COGNITION: Overall cognitive status: Within functional limits for tasks assessed     SENSATION: No tingling or numbness  EDEMA:  Noted and not objectively assessed  LOWER EXTREMITY ROM:  Active ROM Left/Right  09/21/2023 Left   09/23/23 Left 10/11/23 Left 10/13/2023 Left/Right 11/03/2023 Left/Right 11/16/2023  Hip flexion        Hip extension        Hip abduction        Hip adduction        Hip internal rotation        Hip external rotation        Knee flexion 86/142 101 A: 110 P: 115 124 130/148 126/148  Knee extension -8/-2  A: -5 P: 0 -3 -2/0 -1/0  Ankle dorsiflexion        Ankle plantarflexion        Ankle inversion        Ankle eversion         (Blank rows = not tested)  LOWER EXTREMITY STRENGTH: Deferred at evaluation  MMT out of a possible 5 Left/Right  10/13/2023  Hip flexion   Hip extension   Hip abduction   Hip adduction   Hip internal rotation   Hip external rotation   Knee flexion   Knee extension 4/4+  Ankle dorsiflexion   Ankle plantarflexion   Ankle inversion   Ankle eversion    (Blank rows = not tested)   GAIT: Distance walked: 50 feet Assistive device utilized: Crutches Level of assistance: Complete Independence Comments: NA  TODAY'S TREATMENT:                                                                                                                              DATE:  11/16/2023 Recumbent bike Seat 8 for 8 minutes Seated straight leg raises 5 sets of 5 with pause 2 seconds before lift, hold 2 seconds and slow eccentrics 1.5# Seated knee extension machine 90-40 degrees with slow eccentrics 35# 2 sets of 5x up with both, down with 1 (do bilateral) IT Band stretch 4 x 20 seconds left only  Functional Activities: Step-down off 4 inch step 2 sets of 10 slow eccentrics left only (no hands)  Neuromuscular re-education: Wobble board no hands with slow, controlled side to side 3 x 1 minute Single leg balance on foam 2 x 30 seconds each leg   11/10/2023 Recumbent bike Seat 8 and 10 for 3 minutes each Seated straight leg raises 3 sets of 5 with pause 2 seconds before lift, hold 2 seconds and slow eccentrics 1# Seated knee extension machine 90-40 degrees with slow  eccentrics 35# 2 sets of 5x up with both, down with 1 (do bilateral)  Functional Activities: Dynamic heel to toe balance, forward and back  4 laps Dynamic slow marching to heel/toe 4 laps  Neuromuscular re-education: Single-leg stance: eyes open; head turning; eyes closed 2 x 20 seconds each side   11/03/2023 Recumbent bike Seat 8 for 5 minutes total Seated straight leg raises 3 sets of 5 with pause 2 seconds before lift, hold 2 seconds and slow eccentrics Seated knee extension machine 90-40 degrees with slow eccentrics 35# 8 x up with both, down with 1 (do bilateral)  Functional Activities: Double Leg Press 100# full extension and slow eccentrics 15 x Single Leg Press 87# full extension and slow eccentrics 10 x  Neuromuscular re-education: Single-leg stance: eyes open; head turning; eyes closed 2 x 10 seconds each side  PATIENT EDUCATION:  Education details: See above Person educated: Patient Education method: Explanation, Demonstration, Tactile cues, Verbal cues, and Handouts Education comprehension: verbalized understanding, returned demonstration, verbal cues required, tactile cues required, and needs further education  HOME EXERCISE PROGRAM: Access Code: [email or web address] URL: [email or web address] Date: 09/21/2023 Prepared by: Pauletta Browns  Exercises - Long Sitting Quad Set with Towel Roll Under Heel  - 5 x daily - 7 x weekly - 2 sets - 10 reps - 5 secponds hold - Seated Knee Flexion AAROM  - 3-5 x daily - 7 x weekly - 1 sets - 1 reps - 3 minutes hold - Seated Passive Knee Extension  - 2-3 x daily - 7 x weekly - 1 sets - 1 reps - 3-10 minutes hold  ASSESSMENT:  CLINICAL IMPRESSION: [name] pushed his return to work back to 11/30/2023 due to some apprehension about his quadriceps/knee strength and 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  Balance along with quadriceps and general lower extremity strength remain the emphasis with his home and clinic program.  He wants to push his reassessment back since his MD visit and return to work plans have been pushed back and he will benefit from the extra supervised PT.    OBJECTIVE IMPAIRMENTS: Abnormal gait, decreased activity tolerance, decreased balance, decreased endurance, decreased knowledge of condition, difficulty walking, decreased ROM, decreased strength, increased edema, impaired perceived functional ability, obesity, and pain.   ACTIVITY LIMITATIONS: carrying, bending, sitting, standing, squatting, sleeping, stairs, and locomotion level  PARTICIPATION LIMITATIONS: driving, community activity, occupation, and yard work  PERSONAL FACTORS: HTN, HLD, Lt shoulder scope, Lt TKA 09/06/2023, obesity are also affecting patient's functional outcome.   REHAB POTENTIAL: Good  CLINICAL DECISION MAKING: Stable/uncomplicated  EVALUATION COMPLEXITY: Low   GOALS: Goals reviewed with patient? Yes  SHORT TERM GOALS: Target date: 10/19/2023 [name] will be independent with his day 1 HEP Baseline: Started 09/21/2023 Goal status: Met 09/23/2023  2.  Improve left knee AROM to 5 - 0 - 105 degrees Baseline: 8 - 0 - 86 degrees Goal status: MET 10/11/2023   LONG TERM GOALS: Target date: 11/22/2022  Improve FOTO to 56 in 16 visits Baseline: 36 Goal status: Met 10/13/2023  2.  [name] will report left knee pain consistently 0-4/10 on the Numeric Pain Rating Scale Baseline: 2-9/10 Goal status: Partially Met 11/16/2023  3.  Improve left knee AROM to 2 - 0 - 115 degrees Baseline: 8 - 0 - 86 degrees Goal status: Met 11/03/2023  4.  [name] will have improved quadriceps strength as assessed by assistive device free gait without increasing edema Baseline: Bilateral axillary crutches, walker or cane required Goal status: Partially Met 11/16/2023  5.  [name] will be independent with his long-term HEP at DC Baseline: Started 09/21/2023 Goal status: On Going 11/16/2023  PLAN:  PT FREQUENCY: 1 - 2 additional visits  PT  DURATION: 1 - 2 weeks  PLANNED INTERVENTIONS: 97110-Therapeutic exercises, 97530- Therapeutic activity, 97112- Neuromuscular re-education, 97535- Self Care, 62952- Manual therapy, [phone]- Gait training, [phone]- Vasopneumatic device, Patient/Family education, Balance training, Stair training, Joint mobilization, and Cryotherapy  PLAN FOR NEXT SESSION: Quadriceps strength, gait, balance and functional progressions for 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  Reassess and possible DC.   Cherlyn Cushing, PT, MPT 11/16/2023, 2:50 PM

## 2023-11-21 ENCOUNTER — Encounter: Payer: BC Managed Care – PPO | Admitting: Orthopedic Surgery

## 2023-11-23 ENCOUNTER — Ambulatory Visit (INDEPENDENT_AMBULATORY_CARE_PROVIDER_SITE_OTHER): Payer: BC Managed Care – PPO | Admitting: Physical Therapy

## 2023-11-23 ENCOUNTER — Encounter: Payer: Self-pay | Admitting: Physical Therapy

## 2023-11-23 DIAGNOSIS — M25562 Pain in left knee: Secondary | ICD-10-CM

## 2023-11-23 DIAGNOSIS — R600 Localized edema: Secondary | ICD-10-CM

## 2023-11-23 DIAGNOSIS — M6281 Muscle weakness (generalized): Secondary | ICD-10-CM | POA: Diagnosis not present

## 2023-11-23 DIAGNOSIS — M25662 Stiffness of left knee, not elsewhere classified: Secondary | ICD-10-CM | POA: Diagnosis not present

## 2023-11-23 DIAGNOSIS — R262 Difficulty in walking, not elsewhere classified: Secondary | ICD-10-CM

## 2023-11-23 NOTE — Therapy (Signed)
OUTPATIENT PHYSICAL THERAPY LOWER EXTREMITY TREATMENT NOTE DISCHARGE SUMMARY  Patient Name: Bradley Burns MRN: [id number] DOB:1971/03/01, 53 y.o., male Today's Date: 11/23/2023  END OF SESSION:  PT End of Session - 11/23/23 1454     Visit Number 10    Authorization Type BCBS    Authorization - Number of Visits 60    PT Start Time 1455    PT Stop Time 1534    PT Time Calculation (min) 39 min    Activity Tolerance Patient tolerated treatment well;No increased pain;Patient limited by pain    Behavior During Therapy [facility] for tasks assessed/performed               Past Medical History:  Diagnosis Date   Allergy    Hypertension    Past Surgical History:  Procedure Laterality Date   ANKLE ARTHROSCOPY Bilateral    APPENDECTOMY     KNEE ARTHROSCOPY Bilateral    NASAL SEPTUM SURGERY     SHOULDER ARTHROSCOPY Left    TOTAL KNEE ARTHROPLASTY Left 09/06/2023   Procedure: LEFT TOTAL KNEE ARTHROPLASTY;  Surgeon: Cammy Copa, MD;  Location: [facility] OR;  Service: Orthopedics;  Laterality: Left;  RNFA APRIL PLEASE   Patient Active Problem List   Diagnosis Date Noted   OA (osteoarthritis) of knee 09/06/2023   Arthritis of left knee 09/06/2023   Hyperlipidemia 09/08/2019   Vitamin D deficiency 06/03/2017   Hypertension 05/25/2017   Seasonal allergies 05/25/2017   History of smoking for 2-5 years 05/25/2017   Social alcohol use 05/25/2017   Obesity (BMI 30-39.9) 05/25/2017   S/P appendectomy 05/25/2017   GERD (gastroesophageal reflux disease) 05/25/2017    PCP: NA  REFERRING PROVIDER: Julieanne Cotton, PA-C  REFERRING DIAG:  Diagnosis  [phone] (ICD-10-CM) - S/P total knee arthroplasty, left    THERAPY DIAG:  Difficulty in walking, not elsewhere classified  Localized edema  Muscle weakness (generalized)  Stiffness of left knee, not elsewhere classified  Left knee pain, unspecified chronicity  Rationale for Evaluation and Treatment: Rehabilitation  ONSET  DATE: 09/06/2023 TKA  SUBJECTIVE:   SUBJECTIVE STATEMENT: Pt wants to d/c today; overall doing well with plans to return to work next week.   PERTINENT HISTORY: HTN, HLD, Lt shoulder scope, Lt TKA 09/06/2023, obesity   PAIN:  Are you having pain? 0-4/10 this week Pain location: Lt < Rt knee Pain description: Ache, sharp and throbbing at times Aggravating factors: Prolonged postures, particularly WB, too much walking Relieving factors: Ice, meds (hydrocodone PRN, not daily, tylenol 2-4 a day) as needed  PRECAUTIONS: None  RED FLAGS: None   WEIGHT BEARING RESTRICTIONS: No  FALLS:  Has patient fallen in last 6 months? No  LIVING ENVIRONMENT: Lives with: lives with their family and lives with their spouse Lives in: Mobile home Stairs:  OK with crutches and handrail Has following equipment at home: Single point cane, Environmental consultant - 2 wheeled, and Crutches  OCCUPATION: Truck driver, out of work presently  PLOF: Independent  PATIENT GOALS: Return to work, working on cars, Catering manager...  NEXT MD VISIT: 11/30/2023  OBJECTIVE:  Note: Objective measures were completed at Evaluation unless otherwise noted.  DIAGNOSTIC FINDINGS: AP and lateral views of the knee reviewed.  Total knee arthroplasty  prosthesis in good position and alignment without any complicating  features.  There is no evidence of dislocation, periprosthetic fracture,  patella baja/alta  PATIENT SURVEYS:  10/13/2023 FOTO 58 (Goal met)  Eval FOTO 36 (Goal 56 in 16 visits)  COGNITION: Overall  cognitive status: Within functional limits for tasks assessed     SENSATION: No tingling or numbness  EDEMA:  Noted and not objectively assessed  LOWER EXTREMITY ROM:  Active ROM Left/Right  09/21/2023 Left  09/23/23 Left 10/11/23 Left 10/13/2023 Left/Right 11/03/2023 Left/Right 11/16/2023  Hip flexion        Hip extension        Hip abduction        Hip adduction        Hip internal rotation        Hip external rotation         Knee flexion 86/142 101 A: 110 P: 115 124 130/148 126/148  Knee extension -8/-2  A: -5 P: 0 -3 -2/0 -1/0  Ankle dorsiflexion        Ankle plantarflexion        Ankle inversion        Ankle eversion         (Blank rows = not tested)  LOWER EXTREMITY STRENGTH: Deferred at evaluation  MMT out of a possible 5 Left/Right  10/13/2023  Hip flexion   Hip extension   Hip abduction   Hip adduction   Hip internal rotation   Hip external rotation   Knee flexion   Knee extension 4/4+  Ankle dorsiflexion   Ankle plantarflexion   Ankle inversion   Ankle eversion    (Blank rows = not tested)   GAIT: Distance walked: 50 feet Assistive device utilized: Crutches Level of assistance: Complete Independence Comments: NA  TODAY'S TREATMENT:                                                                                                                              DATE:  11/23/23 TherEx Recumbent bike L15 x 8 min; seat 8 Forward step ups x10 reps bil; 6" step without UE support Lateral heel taps from 6" steps 2x10 with Lt foot on step Squats 2x10 RDL 15# KB 2x10 Leg press single limb performed bil 100# 2x10 Knee extension machine 35# bil concentric, single limb eccentric 2x10 bil  11/16/2023 Recumbent bike Seat 8 for 8 minutes Seated straight leg raises 5 sets of 5 with pause 2 seconds before lift, hold 2 seconds and slow eccentrics 1.5# Seated knee extension machine 90-40 degrees with slow eccentrics 35# 2 sets of 5x up with both, down with 1 (do bilateral) IT Band stretch 4 x 20 seconds left only  Functional Activities: Step-down off 4 inch step 2 sets of 10 slow eccentrics left only (no hands)  Neuromuscular re-education: Wobble board no hands with slow, controlled side to side 3 x 1 minute Single leg balance on foam 2 x 30 seconds each leg   11/10/2023 Recumbent bike Seat 8 and 10 for 3 minutes each Seated straight leg raises 3 sets of 5 with pause 2 seconds before  lift, hold 2 seconds and slow eccentrics 1# Seated knee extension machine 90-40 degrees with slow  eccentrics 35# 2 sets of 5x up with both, down with 1 (do bilateral)  Functional Activities: Dynamic heel to toe balance, forward and back 4 laps Dynamic slow marching to heel/toe 4 laps  Neuromuscular re-education: Single-leg stance: eyes open; head turning; eyes closed 2 x 20 seconds each side  PATIENT EDUCATION:  Education details: See above Person educated: Patient Education method: Explanation, Demonstration, Tactile cues, Verbal cues, and Handouts Education comprehension: verbalized understanding, returned demonstration, verbal cues required, tactile cues required, and needs further education  HOME EXERCISE PROGRAM: Access Code: [email or web address] URL: [email or web address] Date: 11/23/2023 Prepared by: Moshe Cipro  Exercises - Long Sitting Quad Set with Towel Roll Under Heel  - 5 x daily - 7 x weekly - 2 sets - 10 reps - 5 secponds hold - Seated Knee Flexion AAROM  - 3-5 x daily - 7 x weekly - 1 sets - 1 reps - 3 minutes hold - Seated Passive Knee Extension  - 2-3 x daily - 7 x weekly - 1 sets - 1 reps - 3-10 minutes hold - Prone Quadricep Stretch with Strap  - 2 x daily - 7 x weekly - 1 sets - 5 reps - 20 seconds hold - Supine ITB Stretch with Strap  - 2-3 x daily - 7 x weekly - 1 sets - 5 reps - 20 seconds hold - Step Up  - 1 x daily - 7 x weekly - 2-3 sets - 10 reps - Lateral Step Down  - 1 x daily - 7 x weekly - 2-3 sets - 10 reps - Squat with Counter Support  - 1 x daily - 7 x weekly - 2-3 sets - 10 reps - Half Deadlift with Kettlebell  - 1 x daily - 7 x weekly - 3 sets - 10 reps  ASSESSMENT:  CLINICAL IMPRESSION: Pt has met all goals at this time and is ready for d/c from PT.  He has HEP to continue to progress strength and balance.  Will d/c PT.    OBJECTIVE IMPAIRMENTS: Abnormal gait, decreased activity tolerance, decreased balance, decreased endurance,  decreased knowledge of condition, difficulty walking, decreased ROM, decreased strength, increased edema, impaired perceived functional ability, obesity, and pain.   ACTIVITY LIMITATIONS: carrying, bending, sitting, standing, squatting, sleeping, stairs, and locomotion level  PARTICIPATION LIMITATIONS: driving, community activity, occupation, and yard work  PERSONAL FACTORS: HTN, HLD, Lt shoulder scope, Lt TKA 09/06/2023, obesity are also affecting patient's functional outcome.   REHAB POTENTIAL: Good  CLINICAL DECISION MAKING: Stable/uncomplicated  EVALUATION COMPLEXITY: Low   GOALS: Goals reviewed with patient? Yes  SHORT TERM GOALS: Target date: 10/19/2023 [name] will be independent with his day 1 HEP Baseline: Started 09/21/2023 Goal status: Met 09/23/2023  2.  Improve left knee AROM to 5 - 0 - 105 degrees Baseline: 8 - 0 - 86 degrees Goal status: MET 10/11/2023   LONG TERM GOALS: Target date: 11/15/2022  Improve FOTO to 56 in 16 visits Baseline: 36 Goal status: Met 10/13/2023  2.  [name] will report left knee pain consistently 0-4/10 on the Numeric Pain Rating Scale Baseline: 0-4/10 Goal status: Met 11/23/2023  3.  Improve left knee AROM to 2 - 0 - 115 degrees Baseline: 8 - 0 - 86 degrees Goal status: Met 11/03/2023  4.  [name] will have improved quadriceps strength as assessed by assistive device free gait without increasing edema Baseline: Goal status: Met 11/23/2023  5.  [name] will be independent with his long-term HEP at DC Baseline: Started 09/21/2023  Goal status: Met 11/23/2023  PLAN:  PT FREQUENCY: 1 - 2 additional visits  PT DURATION: 1 - 2 weeks  PLANNED INTERVENTIONS: 97110-Therapeutic exercises, 97530- Therapeutic activity, 97112- Neuromuscular re-education, [phone]- Self Care, 28413- Manual therapy, [phone]- Gait training, [phone]- Vasopneumatic device, Patient/Family education, Balance training, Stair training, Joint mobilization, and Cryotherapy  PLAN FOR NEXT  SESSION: d/c PT today   Clarita Crane, PT, DPT 11/23/23 3:34 PM   PHYSICAL THERAPY DISCHARGE SUMMARY  Visits from Start of Care: 10  Current functional level related to goals / functional outcomes: See above   Remaining deficits: See above   Education / Equipment: HEP   Patient agrees to discharge. Patient goals were met. Patient is being discharged due to meeting the stated rehab goals.  Clarita Crane, PT, DPT 11/23/23 3:34 PM  [facility] [address] [place], [place], [address] Phone: [phone]   Fax:  [phone]

## 2023-11-24 NOTE — Addendum Note (Signed)
Addended by: Wende Crease on: 11/24/2023 09:49 AM   Modules accepted: Orders

## 2023-11-30 ENCOUNTER — Encounter: Payer: Self-pay | Admitting: Orthopedic Surgery

## 2023-11-30 ENCOUNTER — Ambulatory Visit (INDEPENDENT_AMBULATORY_CARE_PROVIDER_SITE_OTHER): Payer: BC Managed Care – PPO | Admitting: Surgical

## 2023-11-30 DIAGNOSIS — Z96652 Presence of left artificial knee joint: Secondary | ICD-10-CM

## 2023-11-30 NOTE — Progress Notes (Signed)
Post-Op Visit Note   Patient: Bradley Burns           Date of Birth: 07-27-1971           MRN: [id number] Visit Date: 11/30/2023 PCP: Pcp, No   Assessment & Plan:  Chief Complaint:  Chief Complaint  Patient presents with   Left Knee - Follow-up    L TKA 09/06/23   Visit Diagnoses:  1. S/P total knee arthroplasty, left     Plan: Patient is a 53 year old male who presents s/p left total knee arthroplasty on 09/06/2023.  Feels that his left knee is doing well overall.  Only taking Tylenol for pain control.  Just has some soreness on the medial aspect on occasion.  Feels that is continually getting better with each day though.  He has finished physical therapy as of last week.  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  Has no significant buckling of the knee.  He is able to navigate stairs without difficulty.  Getting up from a low toilet seat is no issue for him.  On exam, patient has 0 degrees extension and 125 degrees of knee flexion.  No calf tenderness.  Negative Homans' sign.  Able to perform straight leg raise without extensor lag.  Excellent quad strength rated 5/5.  No gross mid flexion instability.  No significant effusion.  Incision is well-healed without evidence of cellulitis.  No sinus tract noted.  Plan is to return to work tomorrow.  We did discuss dental antibiotic prophylaxis again today.  He did see a dentist several weeks ago for evaluation of the possibility of root canal but they just took x-rays and did not have to perform any procedure.  He will let us know if he requires any antibiotics in the future.  Otherwise he will follow-up with the office as needed.  He is considering having his right knee replaced in the future but we will hold off on this for now until the right knee gets bad enough.  Injections have not really helped his knees historically.  Follow-Up Instructions: No follow-ups on file.   Orders:  No orders of the defined types were  placed in this encounter.  No orders of the defined types were placed in this encounter.   Imaging: No results found.  PMFS History: Patient Active Problem List   Diagnosis Date Noted   OA (osteoarthritis) of knee 09/06/2023   Arthritis of left knee 09/06/2023   Hyperlipidemia 09/08/2019   Vitamin D deficiency 06/03/2017   Hypertension 05/25/2017   Seasonal allergies 05/25/2017   History of smoking for 2-5 years 05/25/2017   Social alcohol use 05/25/2017   Obesity (BMI 30-39.9) 05/25/2017   S/P appendectomy 05/25/2017   GERD (gastroesophageal reflux disease) 05/25/2017   Past Medical History:  Diagnosis Date   Allergy    Hypertension     Family History  Problem Relation Age of Onset   Hypertension Father     Past Surgical History:  Procedure Laterality Date   ANKLE ARTHROSCOPY Bilateral    APPENDECTOMY     KNEE ARTHROSCOPY Bilateral    NASAL SEPTUM SURGERY     SHOULDER ARTHROSCOPY Left    TOTAL KNEE ARTHROPLASTY Left 09/06/2023   Procedure: LEFT TOTAL KNEE ARTHROPLASTY;  Surgeon: Cammy Copa, MD;  Location: [facility] OR;  Service: Orthopedics;  Laterality: Left;  RNFA APRIL PLEASE   Social History   Occupational History   Not on file  Tobacco Use  Smoking status: Never   Smokeless tobacco: Never  Vaping Use   Vaping status: Never Used  Substance and Sexual Activity   Alcohol use: Yes    Alcohol/week: 4.0 standard drinks of alcohol    Types: 4 Standard drinks or equivalent per week   Drug use: No   Sexual activity: Not Currently

## 2024-09-03 ENCOUNTER — Encounter: Payer: Self-pay | Admitting: Radiology
